# Patient Record
Sex: Female | Born: 1990 | Race: White | Hispanic: No | Marital: Single | Smoking: Current every day smoker
Health system: Southern US, Community
[De-identification: ages and names within clinical notes are randomized; demographics above are authoritative.]

## PROBLEM LIST (undated history)

## (undated) ENCOUNTER — Inpatient Hospital Stay (HOSPITAL_COMMUNITY): Payer: Self-pay

## (undated) DIAGNOSIS — M419 Scoliosis, unspecified: Secondary | ICD-10-CM

## (undated) DIAGNOSIS — Z3490 Encounter for supervision of normal pregnancy, unspecified, unspecified trimester: Principal | ICD-10-CM

## (undated) DIAGNOSIS — Z87898 Personal history of other specified conditions: Secondary | ICD-10-CM

## (undated) DIAGNOSIS — G8929 Other chronic pain: Secondary | ICD-10-CM

## (undated) DIAGNOSIS — M549 Dorsalgia, unspecified: Secondary | ICD-10-CM

## (undated) HISTORY — PX: KNEE SURGERY: SHX244

## (undated) HISTORY — DX: Encounter for supervision of normal pregnancy, unspecified, unspecified trimester: Z34.90

## (undated) HISTORY — DX: Dorsalgia, unspecified: M54.9

## (undated) HISTORY — DX: Scoliosis, unspecified: M41.9

## (undated) HISTORY — DX: Personal history of other specified conditions: Z87.898

## (undated) HISTORY — DX: Other chronic pain: G89.29

## (undated) HISTORY — PX: OTHER SURGICAL HISTORY: SHX169

---
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
## 2012-08-12 ENCOUNTER — Emergency Department (HOSPITAL_COMMUNITY)
Admission: EM | Admit: 2012-08-12 | Discharge: 2012-08-12 | Disposition: A | Discharge status: Home / Self Care | Payer: No Typology Code available for payment source | Attending: Emergency Medicine | Admitting: Emergency Medicine

## 2012-08-12 ENCOUNTER — Encounter (HOSPITAL_COMMUNITY): Payer: Self-pay | Admitting: *Deleted

## 2012-08-12 DIAGNOSIS — Y939 Activity, unspecified: Secondary | ICD-10-CM | POA: Insufficient documentation

## 2012-08-12 DIAGNOSIS — F17200 Nicotine dependence, unspecified, uncomplicated: Secondary | ICD-10-CM | POA: Insufficient documentation

## 2012-08-12 DIAGNOSIS — S43499A Other sprain of unspecified shoulder joint, initial encounter: Secondary | ICD-10-CM | POA: Insufficient documentation

## 2012-08-12 DIAGNOSIS — S46912A Strain of unspecified muscle, fascia and tendon at shoulder and upper arm level, left arm, initial encounter: Secondary | ICD-10-CM

## 2012-08-12 DIAGNOSIS — Z79899 Other long term (current) drug therapy: Secondary | ICD-10-CM | POA: Insufficient documentation

## 2012-08-12 MED ORDER — METHOCARBAMOL 500 MG PO TABS: ORAL_TABLET | ORAL | Status: DC

## 2012-08-12 MED ORDER — MELOXICAM 7.5 MG PO TABS: ORAL_TABLET | ORAL | Status: DC
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
## 2012-08-16 NOTE — ED Provider Notes (Signed)
Medical screening examination/treatment/procedure(s) were performed by non-physician practitioner and as supervising physician I was immediately available for consultation/collaboration.    Gearline Spilman R Kirtis Challis, MD 08/16/12 1532 

## 2012-12-25 ENCOUNTER — Emergency Department (HOSPITAL_COMMUNITY)
Admission: EM | Admit: 2012-12-25 | Discharge: 2012-12-25 | Disposition: A | Discharge status: Home / Self Care | Payer: Medicaid Other | Attending: Emergency Medicine | Admitting: Emergency Medicine

## 2012-12-25 ENCOUNTER — Encounter (HOSPITAL_COMMUNITY): Payer: Self-pay

## 2012-12-25 DIAGNOSIS — O99330 Smoking (tobacco) complicating pregnancy, unspecified trimester: Secondary | ICD-10-CM | POA: Insufficient documentation

## 2012-12-25 DIAGNOSIS — R21 Rash and other nonspecific skin eruption: Secondary | ICD-10-CM | POA: Insufficient documentation

## 2012-12-25 DIAGNOSIS — O9989 Other specified diseases and conditions complicating pregnancy, childbirth and the puerperium: Secondary | ICD-10-CM | POA: Insufficient documentation

## 2012-12-25 DIAGNOSIS — L089 Local infection of the skin and subcutaneous tissue, unspecified: Secondary | ICD-10-CM

## 2012-12-25 MED ORDER — SULFAMETHOXAZOLE-TRIMETHOPRIM 800-160 MG PO TABS: 1.0000 | ORAL_TABLET | Freq: Two times a day (BID) | ORAL | Status: DC

## 2012-12-25 NOTE — ED Notes (Signed)
Pt presents with red raised rash with prominent white center of papules. Pt also has new tattoo on left thigh, where rash is predominant.  Pt also denies fever as did partner that has same rash and recent tattoo. NAD noted.

## 2012-12-25 NOTE — ED Provider Notes (Signed)
History  This chart was scribed for Benny Lennert, MD by Bennett Scrape, ED Scribe. This patient was seen in room APFT20/APFT20 and the patient's care was started at 1:39 PM.  CSN: [id number]  Arrival date & time 12/25/12  1210   First MD Initiated Contact with Patient 12/25/12 1339      Chief Complaint  Patient presents with  . Rash     Patient is a 22 y.o. female presenting with rash. The history is provided by the patient. No language interpreter was used.  Rash Location:  Leg Leg rash location:  L upper leg Quality: itchiness and painful   Onset quality:  Gradual Duration:  2 days Timing:  Constant Progression:  Worsening Chronicity:  New Context: sick contacts   Relieved by:  Nothing Worsened by:  Nothing tried Ineffective treatments:  None tried Associated symptoms: no fatigue, no nausea and not vomiting    Laurie Norris is a 22 y.o. female who is currently 7 months pregnant who presents to the Emergency Department complaining of 2 to 3 days of gradual onset, gradually worsening, constant rash described as sore and itchy to the left anterior upper leg. She denies any known insect bites. She reports that her boyfriend has the same symptoms. She states that she has an Chief Financial Officer appointment tomorrow. She denies any other symptoms currently. She does not have a h/o chronic medical conditions. 7 lesions to her left anterior upper thigh that are tender with surrounding erythema   History reviewed. No pertinent past medical history.  Past Surgical History  Procedure Laterality Date  . Knee surgery    . Leg surgery from trauma      Family History  Problem Relation Age of Onset  . Diabetes      History  Substance Use Topics  . Smoking status: Current Every Day Smoker    Types: Cigarettes  . Smokeless tobacco: Not on file  . Alcohol Use: No    OB History   Grav Para Term Preterm Abortions TAB SAB Ect Mult Living   1               Review of Systems   Constitutional: Negative for chills and fatigue.  Gastrointestinal: Negative for nausea and vomiting.  Skin: Positive for rash.    Allergies  Penicillins  Home Medications   Current Outpatient Rx  Name  Route  Sig  Dispense  Refill  . flintstones complete (FLINTSTONES) 60 MG chewable tablet   Oral   Chew 1 tablet by mouth daily.           Triage Vitals: BP 104/58  Pulse 80  Temp(Src) 98.1 F (36.7 C) (Oral)  Resp 18  Ht 5\' 2"  (1.575 m)  Wt 140 lb (63.504 kg)  BMI 25.6 kg/m2  SpO2 100%  LMP 07/17/2012  Physical Exam  Nursing note and vitals reviewed. Constitutional: She is oriented to person, place, and time. She appears well-developed and well-nourished. No distress.  HENT:  Head: Normocephalic and atraumatic.  Eyes: Conjunctivae are normal.  Neck: Neck supple. No tracheal deviation present.  Cardiovascular: Normal rate.   No murmur heard. Pulmonary/Chest: Effort normal.  Musculoskeletal: Normal range of motion. She exhibits no edema.  Neurological: She is alert and oriented to person, place, and time.  Skin: Skin is warm and dry.  7 lesions to her left anterior upper thigh that are tender with surrounding erythema and one the the LUQ of the abdomen  Psychiatric: She has a normal  mood and affect. Her behavior is normal.    ED Course  Procedures (including critical care time)  DIAGNOSTIC STUDIES: Oxygen Saturation is 100% on room air, normal by my interpretation.    COORDINATION OF CARE: 1:46 PM-Discussed discharge plan which includes antibiotics with pt and pt agreed to plan. Also advised pt to follow up with OB-GYN as planned and pt agreed.  Labs Reviewed - No data to display No results found.   No diagnosis found.    MDM   The chart was scribed for me under my direct supervision.  I personally performed the history, physical, and medical decision making and all procedures in the evaluation of this patient.Benny Lennert, MD 12/25/12  1351

## 2012-12-25 NOTE — ED Notes (Signed)
Pt c/o itchy rash to left upper leg x 2 or 3 days ago.  Reports boyfriend has similar rash.  PT also reports is 7months pregnant but is denying any pregnancy related complaints.

## 2014-08-24 ENCOUNTER — Encounter (HOSPITAL_COMMUNITY): Payer: Self-pay

## 2014-10-23 NOTE — L&D Delivery Note (Cosign Needed)
Delivery Note Pt pushed well and at 4:14 AM a viable female was delivered via Vaginal, Spontaneous Delivery (Presentation: Left Occiput Anterior).  APGAR: 6, 9; weight 8 lb 6.6 oz (3815 g). Infant dried and lifted to pt's abd. Cord clamped and cut by pt. Hospital cord blood sample collected.  Placenta status: Intact, Manual removal.  Cord: 3 vessels  Anesthesia: Epidural  Episiotomy: None Lacerations: None Est. Blood Loss (mL): 328   Mom to postpartum.  Baby to Couplet care / Skin to Skin.   Cam Hai CNM 04/17/2015, 4:34 AM

## 2014-10-26 ENCOUNTER — Ambulatory Visit (INDEPENDENT_AMBULATORY_CARE_PROVIDER_SITE_OTHER): Payer: Medicaid Other | Admitting: Adult Health

## 2014-10-26 ENCOUNTER — Encounter: Payer: Self-pay | Admitting: Adult Health

## 2014-10-26 ENCOUNTER — Encounter: Payer: Medicaid Other | Admitting: Adult Health

## 2014-10-26 VITALS — BP 98/48 | Ht 64.0 in | Wt 121.5 lb

## 2014-10-26 DIAGNOSIS — Z3490 Encounter for supervision of normal pregnancy, unspecified, unspecified trimester: Secondary | ICD-10-CM | POA: Insufficient documentation

## 2014-10-26 DIAGNOSIS — M549 Dorsalgia, unspecified: Secondary | ICD-10-CM

## 2014-10-26 DIAGNOSIS — F1191 Opioid use, unspecified, in remission: Secondary | ICD-10-CM | POA: Insufficient documentation

## 2014-10-26 DIAGNOSIS — G8929 Other chronic pain: Secondary | ICD-10-CM

## 2014-10-26 DIAGNOSIS — F1310 Sedative, hypnotic or anxiolytic abuse, uncomplicated: Secondary | ICD-10-CM

## 2014-10-26 DIAGNOSIS — Z3201 Encounter for pregnancy test, result positive: Secondary | ICD-10-CM

## 2014-10-26 DIAGNOSIS — Z87898 Personal history of other specified conditions: Secondary | ICD-10-CM

## 2014-10-26 HISTORY — DX: Personal history of other specified conditions: Z87.898

## 2014-10-26 HISTORY — DX: Encounter for supervision of normal pregnancy, unspecified, unspecified trimester: Z34.90

## 2014-10-26 HISTORY — DX: Other chronic pain: G89.29

## 2014-10-26 HISTORY — DX: Opioid use, unspecified, in remission: F11.91

## 2014-10-26 LAB — POCT URINE PREGNANCY: PREG TEST UR: POSITIVE

## 2014-10-26 NOTE — Progress Notes (Signed)
Subjective:     Patient ID: Laurie Norris, female   DOB: [birth year], 24 y.o.   MRN: [id number]  HPI [name] is a 24 year old white female in for UPT.She complains of dizzy spells at times.She has history of chronic back pain and was taking 10-11 percocet or hydrocodone for years and has tried to wean her self off and is down to 3 in over a 4 week period, but she is getting them off the street and she is smoking pot to help with this.   Review of Systems See HPI Reviewed past medical,surgical, social and family history. Reviewed medications and allergies.     Objective:   Physical Exam BP 98/48 mmHg  Ht  (1.626 m)  Wt 121 lb 8 oz (55.112 kg)  BMI 20.85 kg/m2  LMP 11/01/2015UPT +, about 9+1 weeks with EDD 05/31/15 by LMP,medicaid form given.Will get in ASAP for [place] and new OB visit.She is taking flintstones.Sarie knows that she should not take the meds without RX and needs to stop pot.    Assessment:     Pregnant +UPT Chronic back pain  History of narcotic use    Plan:     Return in 1 day for dating [place] and new OB with Kim,I spoke with Selena Batten today about her and her history Review handout on first trimester and chronic back pain

## 2014-10-26 NOTE — Patient Instructions (Signed)
Chronic Back Pain  When back pain lasts longer than 3 months, it is called chronic back pain.People with chronic back pain often go through certain periods that are more intense (flare-ups).  CAUSES Chronic back pain can be caused by wear and tear (degeneration) on different structures in your back. These structures include:  The bones of your spine (vertebrae) and the joints surrounding your spinal cord and nerve roots (facets).  The strong, fibrous tissues that connect your vertebrae (ligaments). Degeneration of these structures may result in pressure on your nerves. This can lead to constant pain. HOME CARE INSTRUCTIONS  Avoid bending, heavy lifting, prolonged sitting, and activities which make the problem worse.  Take brief periods of rest throughout the day to reduce your pain. Lying down or standing usually is better than sitting while you are resting.  Take over-the-counter or prescription medicines only as directed by your caregiver. SEEK IMMEDIATE MEDICAL CARE IF:   You have weakness or numbness in one of your legs or feet.  You have trouble controlling your bladder or bowels.  You have nausea, vomiting, abdominal pain, shortness of breath, or fainting. Document Released: 11/16/2004 Document Revised: 01/01/2012 Document Reviewed: 09/23/2011 [facility] Patient Information 2015 [place], [place]. This information is not intended to replace advice given to you by your health care provider. Make sure you discuss any questions you have with your health care provider. First Trimester of Pregnancy The first trimester of pregnancy is from week 1 until the end of week 12 (months 1 through 3). A week after a sperm fertilizes an egg, the egg will implant on the wall of the uterus. This embryo will begin to develop into a baby. Genes from you and your partner are forming the baby. The female genes determine whether the baby is a boy or a girl. At 6-8 weeks, the eyes and face are formed, and the  heartbeat can be seen on ultrasound. At the end of 12 weeks, all the baby's organs are formed.  Now that you are pregnant, you will want to do everything you can to have a healthy baby. Two of the most important things are to get good prenatal care and to follow your health care provider's instructions. Prenatal care is all the medical care you receive before the baby's birth. This care will help prevent, find, and treat any problems during the pregnancy and childbirth. BODY CHANGES Your body goes through many changes during pregnancy. The changes vary from woman to woman.   You may gain or lose a couple of pounds at first.  You may feel sick to your stomach (nauseous) and throw up (vomit). If the vomiting is uncontrollable, call your health care provider.  You may tire easily.  You may develop headaches that can be relieved by medicines approved by your health care provider.  You may urinate more often. Painful urination may mean you have a bladder infection.  You may develop heartburn as a result of your pregnancy.  You may develop constipation because certain hormones are causing the muscles that push waste through your intestines to slow down.  You may develop hemorrhoids or swollen, bulging veins (varicose veins).  Your breasts may begin to grow larger and become tender. Your nipples may stick out more, and the tissue that surrounds them (areola) may become darker.  Your gums may bleed and may be sensitive to brushing and flossing.  Dark spots or blotches (chloasma, mask of pregnancy) may develop on your face. This will likely fade after  the baby is born.  Your menstrual periods will stop.  You may have a loss of appetite.  You may develop cravings for certain kinds of food.  You may have changes in your emotions from day to day, such as being excited to be pregnant or being concerned that something may go wrong with the pregnancy and baby.  You may have more vivid and strange  dreams.  You may have changes in your hair. These can include thickening of your hair, rapid growth, and changes in texture. Some women also have hair loss during or after pregnancy, or hair that feels dry or thin. Your hair will most likely return to normal after your baby is born. WHAT TO EXPECT AT YOUR PRENATAL VISITS During a routine prenatal visit:  You will be weighed to make sure you and the baby are growing normally.  Your blood pressure will be taken.  Your abdomen will be measured to track your baby's growth.  The fetal heartbeat will be listened to starting around week 10 or 12 of your pregnancy.  Test results from any previous visits will be discussed. Your health care provider may ask you:  How you are feeling.  If you are feeling the baby move.  If you have had any abnormal symptoms, such as leaking fluid, bleeding, severe headaches, or abdominal cramping.  If you have any questions. Other tests that may be performed during your first trimester include:  Blood tests to find your blood type and to check for the presence of any previous infections. They will also be used to check for low iron levels (anemia) and Rh antibodies. Later in the pregnancy, blood tests for diabetes will be done along with other tests if problems develop.  Urine tests to check for infections, diabetes, or protein in the urine.  An ultrasound to confirm the proper growth and development of the baby.  An amniocentesis to check for possible genetic problems.  Fetal screens for spina bifida and Down syndrome.  You may need other tests to make sure you and the baby are doing well. HOME CARE INSTRUCTIONS  Medicines  Follow your health care provider's instructions regarding medicine use. Specific medicines may be either safe or unsafe to take during pregnancy.  Take your prenatal vitamins as directed.  If you develop constipation, try taking a stool softener if your health care provider  approves. Diet  Eat regular, well-balanced meals. Choose a variety of foods, such as meat or vegetable-based protein, fish, milk and low-fat dairy products, vegetables, fruits, and whole grain breads and cereals. Your health care provider will help you determine the amount of weight gain that is right for you.  Avoid raw meat and uncooked cheese. These carry germs that can cause birth defects in the baby.  Eating four or five small meals rather than three large meals a day may help relieve nausea and vomiting. If you start to feel nauseous, eating a few soda crackers can be helpful. Drinking liquids between meals instead of during meals also seems to help nausea and vomiting.  If you develop constipation, eat more high-fiber foods, such as fresh vegetables or fruit and whole grains. Drink enough fluids to keep your urine clear or pale yellow. Activity and Exercise  Exercise only as directed by your health care provider. Exercising will help you:  Control your weight.  Stay in shape.  Be prepared for labor and delivery.  Experiencing pain or cramping in the lower abdomen or low back is  a good sign that you should stop exercising. Check with your health care provider before continuing normal exercises.  Try to avoid standing for long periods of time. Move your legs often if you must stand in one place for a long time.  Avoid heavy lifting.  Wear low-heeled shoes, and practice good posture.  You may continue to have sex unless your health care provider directs you otherwise. Relief of Pain or Discomfort  Wear a good support bra for breast tenderness.   Take warm sitz baths to soothe any pain or discomfort caused by hemorrhoids. Use hemorrhoid cream if your health care provider approves.   Rest with your legs elevated if you have leg cramps or low back pain.  If you develop varicose veins in your legs, wear support hose. Elevate your feet for 15 minutes, 3-4 times a day. Limit salt  in your diet. Prenatal Care  Schedule your prenatal visits by the twelfth week of pregnancy. They are usually scheduled monthly at first, then more often in the last 2 months before delivery.  Write down your questions. Take them to your prenatal visits.  Keep all your prenatal visits as directed by your health care provider. Safety  Wear your seat belt at all times when driving.  Make a list of emergency phone numbers, including numbers for family, friends, the hospital, and police and fire departments. General Tips  Ask your health care provider for a referral to a local prenatal education class. Begin classes no later than at the beginning of month 6 of your pregnancy.  Ask for help if you have counseling or nutritional needs during pregnancy. Your health care provider can offer advice or refer you to specialists for help with various needs.  Do not use hot tubs, steam rooms, or saunas.  Do not douche or use tampons or scented sanitary pads.  Do not cross your legs for long periods of time.  Avoid cat litter boxes and soil used by cats. These carry germs that can cause birth defects in the baby and possibly loss of the fetus by miscarriage or stillbirth.  Avoid all smoking, herbs, alcohol, and medicines not prescribed by your health care provider. Chemicals in these affect the formation and growth of the baby.  Schedule a dentist appointment. At home, brush your teeth with a soft toothbrush and be gentle when you floss. SEEK MEDICAL CARE IF:   You have dizziness.  You have mild pelvic cramps, pelvic pressure, or nagging pain in the abdominal area.  You have persistent nausea, vomiting, or diarrhea.  You have a bad smelling vaginal discharge.  You have pain with urination.  You notice increased swelling in your face, hands, legs, or ankles. SEEK IMMEDIATE MEDICAL CARE IF:   You have a fever.  You are leaking fluid from your vagina.  You have spotting or bleeding  from your vagina.  You have severe abdominal cramping or pain.  You have rapid weight gain or loss.  You vomit blood or material that looks like coffee grounds.  You are exposed to [place] measles and have never had them.  You are exposed to fifth disease or chickenpox.  You develop a severe headache.  You have shortness of breath.  You have any kind of trauma, such as from a fall or a car accident. Document Released: 10/03/2001 Document Revised: 02/23/2014 Document Reviewed: 08/19/2013 [facility] Patient Information 2015 [place], [place]. This information is not intended to replace advice given to you by your health care  provider. Make sure you discuss any questions you have with your health care provider. Return in 1 day for [place] and see Selena Batten

## 2014-10-27 ENCOUNTER — Ambulatory Visit: Payer: Medicaid Other

## 2014-10-27 ENCOUNTER — Encounter: Payer: Medicaid Other | Admitting: Women's Health

## 2014-10-29 ENCOUNTER — Other Ambulatory Visit: Payer: Self-pay | Admitting: Adult Health

## 2014-10-29 ENCOUNTER — Ambulatory Visit (INDEPENDENT_AMBULATORY_CARE_PROVIDER_SITE_OTHER): Payer: Medicaid Other

## 2014-10-29 DIAGNOSIS — O0932 Supervision of pregnancy with insufficient antenatal care, second trimester: Secondary | ICD-10-CM

## 2014-10-29 DIAGNOSIS — O99320 Drug use complicating pregnancy, unspecified trimester: Secondary | ICD-10-CM

## 2014-10-29 DIAGNOSIS — O3680X1 Pregnancy with inconclusive fetal viability, fetus 1: Secondary | ICD-10-CM

## 2014-10-29 DIAGNOSIS — F1910 Other psychoactive substance abuse, uncomplicated: Secondary | ICD-10-CM

## 2014-10-29 DIAGNOSIS — Z3490 Encounter for supervision of normal pregnancy, unspecified, unspecified trimester: Secondary | ICD-10-CM

## 2014-10-29 NOTE — Progress Notes (Signed)
U/S-single active fetus, meas c/w 16+6 wks EDD 04/09/2015, cx appears closed(3.5cm), fluid WNL, posterior Gr 0 placenta, bilateral adnexa appears WNL, FHR-155 bpm, ?female fetus?, pt returning for an anatomy screen in 2-3 wks

## 2014-11-03 ENCOUNTER — Ambulatory Visit (INDEPENDENT_AMBULATORY_CARE_PROVIDER_SITE_OTHER): Payer: Medicaid Other | Admitting: Women's Health

## 2014-11-03 ENCOUNTER — Encounter: Payer: Self-pay | Admitting: Women's Health

## 2014-11-03 VITALS — BP 98/62 | Wt 121.0 lb

## 2014-11-03 DIAGNOSIS — O09892 Supervision of other high risk pregnancies, second trimester: Secondary | ICD-10-CM

## 2014-11-03 DIAGNOSIS — F1210 Cannabis abuse, uncomplicated: Secondary | ICD-10-CM

## 2014-11-03 DIAGNOSIS — Z363 Encounter for antenatal screening for malformations: Secondary | ICD-10-CM

## 2014-11-03 DIAGNOSIS — F1120 Opioid dependence, uncomplicated: Secondary | ICD-10-CM | POA: Insufficient documentation

## 2014-11-03 DIAGNOSIS — Z3482 Encounter for supervision of other normal pregnancy, second trimester: Secondary | ICD-10-CM

## 2014-11-03 DIAGNOSIS — Z124 Encounter for screening for malignant neoplasm of cervix: Secondary | ICD-10-CM

## 2014-11-03 DIAGNOSIS — Z1371 Encounter for nonprocreative screening for genetic disease carrier status: Secondary | ICD-10-CM

## 2014-11-03 DIAGNOSIS — F17200 Nicotine dependence, unspecified, uncomplicated: Secondary | ICD-10-CM

## 2014-11-03 DIAGNOSIS — Z118 Encounter for screening for other infectious and parasitic diseases: Secondary | ICD-10-CM

## 2014-11-03 DIAGNOSIS — Z0283 Encounter for blood-alcohol and blood-drug test: Secondary | ICD-10-CM

## 2014-11-03 DIAGNOSIS — Z331 Pregnant state, incidental: Secondary | ICD-10-CM

## 2014-11-03 DIAGNOSIS — Z1159 Encounter for screening for other viral diseases: Secondary | ICD-10-CM

## 2014-11-03 DIAGNOSIS — O99322 Drug use complicating pregnancy, second trimester: Secondary | ICD-10-CM

## 2014-11-03 DIAGNOSIS — F1410 Cocaine abuse, uncomplicated: Secondary | ICD-10-CM

## 2014-11-03 DIAGNOSIS — Z0184 Encounter for antibody response examination: Secondary | ICD-10-CM

## 2014-11-03 DIAGNOSIS — F1129 Opioid dependence with unspecified opioid-induced disorder: Secondary | ICD-10-CM

## 2014-11-03 DIAGNOSIS — Z1389 Encounter for screening for other disorder: Secondary | ICD-10-CM

## 2014-11-03 DIAGNOSIS — Z114 Encounter for screening for human immunodeficiency virus [HIV]: Secondary | ICD-10-CM

## 2014-11-03 DIAGNOSIS — O09899 Supervision of other high risk pregnancies, unspecified trimester: Secondary | ICD-10-CM | POA: Insufficient documentation

## 2014-11-03 DIAGNOSIS — Z720 Tobacco use: Secondary | ICD-10-CM

## 2014-11-03 DIAGNOSIS — Z113 Encounter for screening for infections with a predominantly sexual mode of transmission: Secondary | ICD-10-CM

## 2014-11-03 LAB — POCT URINALYSIS DIPSTICK
Blood, UA: NEGATIVE
Glucose, UA: NEGATIVE
Ketones, UA: NEGATIVE
Leukocytes, UA: NEGATIVE
Nitrite, UA: NEGATIVE
PROTEIN UA: NEGATIVE

## 2014-11-03 NOTE — Progress Notes (Addendum)
Subjective:  Laurie Norris is a 24 y.o. 102P1001 Caucasian female at 7961w4d by 16wk u/s, being seen today for her first obstetrical visit.  Her obstetrical history is significant for term uncomplicated SVB of 10lb baby w/o GDM 1st pregnancy. Smoker 1-2ppd now down to 1/2ppd-not ready to quit. Chronic back pain/scoliosis, addicted to oxycodone 10/325mg  10-15/day has weaned herself down to 5/325mg  TID, no rx, buys off street. .  Daily ha's, taking ~6 500mg  apap daily in addition to the apap in her oxycodone. Pregnancy history fully reviewed.  Patient reports some nausea, usually vomits once q am- delcines meds at this time. Denies vb, cramping, uti s/s, abnormal/malodorous vag d/c, or vulvovaginal itching/irritation.  BP 98/62 mmHg  Wt 121 lb (54.885 kg)  LMP 08/23/2014  HISTORY: OB History  Gravida Para Term Preterm AB SAB TAB Ectopic Multiple Living  2 1 1       1     # Outcome Date GA Lbr Len/2nd Weight Sex Delivery Anes PTL Lv  2 Current           1 Term 05/13/11 3523w0d  10 lb 2 oz (4.593 kg) M Vag-Spont  N Y    Obstetric Comments  No GDM   Past Medical History  Diagnosis Date  . Scoliosis   . Chronic back pain 10/26/2014  . History of narcotic use 10/26/2014  . Pregnant 10/26/2014   Past Surgical History  Procedure Laterality Date  . Knee surgery    . Leg surgery from trauma     Family History  Problem Relation Age of Onset  . Diabetes Brother   . COPD Maternal Grandmother   . Arthritis Maternal Grandfather     Exam   System:     General: Well developed & nourished, no acute distress   Skin: Warm & dry, normal coloration and turgor, no rashes   Neurologic: Alert & oriented, normal mood   Cardiovascular: Regular rate & rhythm   Respiratory: Effort & rate normal, LCTAB, acyanotic   Abdomen: Soft, non tender   Extremities: normal strength, tone  [facility]  FHR: 148 via doppler   Assessment:   Pregnancy: G2P1001 Patient Active  Problem List   Diagnosis Date Noted  . Supervision of other high-risk pregnancy 11/03/2014    Priority: High  . Opiate addiction 11/03/2014    Priority: High  . Chronic back pain 10/26/2014  . History of narcotic use 10/26/2014  . Pregnant 10/26/2014  . 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 BUCKET HANDLE TEAR OF LATERAL MENISCUS 09/27/2009  . KNEE JOINT INSTABILITY 09/01/2009    9061w4d G2P1001 New OB visit Smoker Chronic back pain/scoliosis Opioid addiction, no rx N/V HA Excessive apap use H/O LGA infant w/o GDM  Plan:  Initial labs drawn Continue prenatal vitamins Problem list reviewed and updated Reviewed n/v relief measures and warning s/s to report Reviewed recommended weight gain based on pre-gravid BMI Encouraged well-balanced diet Genetic Screening discussed Quad Screen: requested Cystic fibrosis screening discussed requested Ultrasound discussed; fetal survey: requested Follow up in 1 weeks for visit w/ [facility] to discuss pain med plan for pregnancy, then 2 weeks for anatomy u/s & visit To decrease apap, no more than 4g total/day- less if possible Increase fluids, eat small frequent meals/snacks w/ protein to see if helps w/ HAs [facility] completed [facility] Smokes 1/2pp/day, advised cessation, discussed risks to fetus while pregnant, to infant pp, and to herself. Offered QuitlineNC, declined.  Marge Duncans CNM, [facility] 11/03/2014 2:12 PM

## 2014-11-03 NOTE — Patient Instructions (Addendum)
Nausea & Vomiting  Have saltine crackers or pretzels by your bed and eat a few bites before you raise your head out of bed in the morning  Eat small frequent meals throughout the day instead of large meals  Drink plenty of fluids throughout the day to stay hydrated, just don't drink a lot of fluids with your meals.  This can make your stomach fill up faster making you feel sick  Do not brush your teeth right after you eat  Products with real ginger are good for nausea, like ginger ale and ginger hard candy Make sure it says made with real ginger!  Sucking on sour candy like lemon heads is also good for nausea  If your prenatal vitamins make you nauseated, take them at night so you will sleep through the nausea  Sea Bands  If you feel like you need medicine for the nausea & vomiting please let us know  If you are unable to keep any fluids or food down please let us know    Second Trimester of Pregnancy The second trimester is from week 13 through week 28, months 4 through 6. The second trimester is often a time when you feel your best. Your body has also adjusted to being pregnant, and you begin to feel better physically. Usually, morning sickness has lessened or quit completely, you may have more energy, and you may have an increase in appetite. The second trimester is also a time when the fetus is growing rapidly. At the end of the sixth month, the fetus is about 9 inches long and weighs about 1 pounds. You will likely begin to feel the baby move (quickening) between 18 and 20 weeks of the pregnancy. BODY CHANGES Your body goes through many changes during pregnancy. The changes vary from woman to woman.  11. Your weight will continue to increase. You will notice your lower abdomen bulging out. 12. You may begin to get stretch marks on your hips, abdomen, and breasts. 13. You may develop headaches that can be relieved by medicines approved by your health care provider. 14. You may  urinate more often because the fetus is pressing on your bladder. 15. You may develop or continue to have heartburn as a result of your pregnancy. 16. You may develop constipation because certain hormones are causing the muscles that push waste through your intestines to slow down. 17. You may develop hemorrhoids or swollen, bulging veins (varicose veins). 18. You may have back pain because of the weight gain and pregnancy hormones relaxing your joints between the bones in your pelvis and as a result of a shift in weight and the muscles that support your balance. 19. Your breasts will continue to grow and be tender. 20. Your gums may bleed and may be sensitive to brushing and flossing. 21. Dark spots or blotches (chloasma, mask of pregnancy) may develop on your face. This will likely fade after the baby is born. 22. A dark line from your belly button to the pubic area (linea nigra) may appear. This will likely fade after the baby is born. 23. You may have changes in your hair. These can include thickening of your hair, rapid growth, and changes in texture. Some women also have hair loss during or after pregnancy, or hair that feels dry or thin. Your hair will most likely return to normal after your baby is born. WHAT TO EXPECT AT YOUR PRENATAL VISITS During a routine prenatal visit:  You will be weighed to  make sure you and the fetus are growing normally.  Your blood pressure will be taken.  Your abdomen will be measured to track your baby's growth.  The fetal heartbeat will be listened to.  Any test results from the previous visit will be discussed. Your health care provider may ask you:  How you are feeling.  If you are feeling the baby move.  If you have had any abnormal symptoms, such as leaking fluid, bleeding, severe headaches, or abdominal cramping.  If you have any questions. Other tests that may be performed during your second trimester include:  Blood tests that check  for:  Low iron levels (anemia).  Gestational diabetes (between 24 and 28 weeks).  Rh antibodies.  Urine tests to check for infections, diabetes, or protein in the urine.  An ultrasound to confirm the proper growth and development of the baby.  An amniocentesis to check for possible genetic problems.  Fetal screens for spina bifida and Down syndrome. HOME CARE INSTRUCTIONS   Avoid all smoking, herbs, alcohol, and unprescribed drugs. These chemicals affect the formation and growth of the baby.  Follow your health care provider's instructions regarding medicine use. There are medicines that are either safe or unsafe to take during pregnancy.  Exercise only as directed by your health care provider. Experiencing uterine cramps is a good sign to stop exercising.  Continue to eat regular, healthy meals.  Wear a good support bra for breast tenderness.  Do not use hot tubs, steam rooms, or saunas.  Wear your seat belt at all times when driving.  Avoid raw meat, uncooked cheese, cat litter boxes, and soil used by cats. These carry germs that can cause birth defects in the baby.  Take your prenatal vitamins.  Try taking a stool softener (if your health care provider approves) if you develop constipation. Eat more high-fiber foods, such as fresh vegetables or fruit and whole grains. Drink plenty of fluids to keep your urine clear or pale yellow.  Take warm sitz baths to soothe any pain or discomfort caused by hemorrhoids. Use hemorrhoid cream if your health care provider approves.  If you develop varicose veins, wear support hose. Elevate your feet for 15 minutes, 3-4 times a day. Limit salt in your diet.  Avoid heavy lifting, wear low heel shoes, and practice good posture.  Rest with your legs elevated if you have leg cramps or low back pain.  Visit your dentist if you have not gone yet during your pregnancy. Use a soft toothbrush to brush your teeth and be gentle when you  floss.  A sexual relationship may be continued unless your health care provider directs you otherwise.  Continue to go to all your prenatal visits as directed by your health care provider. SEEK MEDICAL CARE IF:   You have dizziness.  You have mild pelvic cramps, pelvic pressure, or nagging pain in the abdominal area.  You have persistent nausea, vomiting, or diarrhea.  You have a bad smelling vaginal discharge.  You have pain with urination. SEEK IMMEDIATE MEDICAL CARE IF:   You have a fever.  You are leaking fluid from your vagina.  You have spotting or bleeding from your vagina.  You have severe abdominal cramping or pain.  You have rapid weight gain or loss.  You have shortness of breath with chest pain.  You notice sudden or extreme swelling of your face, hands, ankles, feet, or legs.  You have not felt your baby move in over an hour.  You have severe headaches that do not go away with medicine.  You have vision changes. Document Released: 10/03/2001 Document Revised: 10/14/2013 Document Reviewed: 12/10/2012 [facility] Patient Information 2015 [place], [place]. This information is not intended to replace advice given to you by your health care provider. Make sure you discuss any questions you have with your health care provider.

## 2014-11-04 ENCOUNTER — Encounter: Payer: Self-pay | Admitting: Women's Health

## 2014-11-04 DIAGNOSIS — O99320 Drug use complicating pregnancy, unspecified trimester: Secondary | ICD-10-CM | POA: Insufficient documentation

## 2014-11-04 DIAGNOSIS — Z6791 Unspecified blood type, Rh negative: Secondary | ICD-10-CM | POA: Insufficient documentation

## 2014-11-04 DIAGNOSIS — O26899 Other specified pregnancy related conditions, unspecified trimester: Secondary | ICD-10-CM

## 2014-11-04 DIAGNOSIS — F1290 Cannabis use, unspecified, uncomplicated: Secondary | ICD-10-CM | POA: Insufficient documentation

## 2014-11-04 DIAGNOSIS — F1490 Cocaine use, unspecified, uncomplicated: Secondary | ICD-10-CM

## 2014-11-04 LAB — COMPREHENSIVE METABOLIC PANEL
ALT: 9 U/L (ref 0–35)
AST: 11 U/L (ref 0–37)
Albumin: 3.7 g/dL (ref 3.5–5.2)
Alkaline Phosphatase: 61 U/L (ref 39–117)
BILIRUBIN TOTAL: 0.4 mg/dL (ref 0.2–1.2)
BUN: 11 mg/dL (ref 6–23)
CO2: 22 mEq/L (ref 19–32)
Calcium: 8.8 mg/dL (ref 8.4–10.5)
Chloride: 105 mEq/L (ref 96–112)
Creat: 0.55 mg/dL (ref 0.50–1.10)
Glucose, Bld: 72 mg/dL (ref 70–99)
Potassium: 4.2 mEq/L (ref 3.5–5.3)
SODIUM: 138 meq/L (ref 135–145)
Total Protein: 6.6 g/dL (ref 6.0–8.3)

## 2014-11-04 LAB — VARICELLA ZOSTER ANTIBODY, IGG: VARICELLA IGG: 216.9 {index} — AB (ref <135.00)

## 2014-11-04 LAB — CBC
HEMATOCRIT: 36.2 % (ref 36.0–46.0)
HEMOGLOBIN: 12.2 g/dL (ref 12.0–15.0)
MCH: 30.8 pg (ref 26.0–34.0)
MCHC: 33.7 g/dL (ref 30.0–36.0)
MCV: 91.4 fL (ref 78.0–100.0)
MPV: 10.3 fL (ref 8.6–12.4)
Platelets: 257 10*3/uL (ref 150–400)
RBC: 3.96 MIL/uL (ref 3.87–5.11)
RDW: 13.6 % (ref 11.5–15.5)
WBC: 7.5 10*3/uL (ref 4.0–10.5)

## 2014-11-04 LAB — CYSTIC FIBROSIS DIAGNOSTIC STUDY

## 2014-11-04 LAB — URINALYSIS, ROUTINE W REFLEX MICROSCOPIC
BILIRUBIN URINE: NEGATIVE
GLUCOSE, UA: NEGATIVE mg/dL
HGB URINE DIPSTICK: NEGATIVE
Ketones, ur: NEGATIVE mg/dL
LEUKOCYTES UA: NEGATIVE
Nitrite: NEGATIVE
PROTEIN: NEGATIVE mg/dL
Specific Gravity, Urine: 1.026 (ref 1.005–1.030)
UROBILINOGEN UA: 0.2 mg/dL (ref 0.0–1.0)
pH: 7 (ref 5.0–8.0)

## 2014-11-04 LAB — OXYCODONE SCREEN, UA, RFLX CONFIRM

## 2014-11-04 LAB — DRUG SCREEN, URINE, NO CONFIRMATION
AMPHETAMINE SCRN UR: NEGATIVE
BARBITURATE QUANT UR: NEGATIVE
Benzodiazepines.: NEGATIVE
CREATININE, U: 200.1 mg/dL
Cocaine Metabolites: POSITIVE — AB
Marijuana Metabolite: POSITIVE — AB
Methadone: NEGATIVE
OPIATE SCREEN, URINE: NEGATIVE
Phencyclidine (PCP): NEGATIVE
Propoxyphene: NEGATIVE

## 2014-11-04 LAB — HEPATITIS B SURFACE ANTIGEN: HEP B S AG: NEGATIVE

## 2014-11-04 LAB — GC/CHLAMYDIA PROBE AMP
CT Probe RNA: NEGATIVE
GC PROBE AMP APTIMA: NEGATIVE

## 2014-11-04 LAB — ABO AND RH: Rh Type: NEGATIVE

## 2014-11-04 LAB — HIV ANTIBODY (ROUTINE TESTING W REFLEX): HIV: NONREACTIVE

## 2014-11-04 LAB — ANTIBODY SCREEN: Antibody Screen: NEGATIVE

## 2014-11-04 LAB — RPR

## 2014-11-04 LAB — RUBELLA SCREEN: Rubella: 2.03 Index — ABNORMAL HIGH (ref <0.90)

## 2014-11-05 LAB — URINE CULTURE: Colony Count: >=100000

## 2014-11-06 ENCOUNTER — Encounter: Payer: Medicaid Other | Admitting: Obstetrics and Gynecology

## 2014-11-08 LAB — OPIATES/OPIOIDS (LC/MS-MS)
CODEINE URINE: NEGATIVE ng/mL (ref <50)
HYDROCODONE: 77 ng/mL — AB (ref <50)
Hydromorphone: 578 ng/mL — ABNORMAL HIGH (ref <50)
MORPHINE: NEGATIVE ng/mL (ref <50)
NORHYDROCODONE, UR: 1085 ng/mL — AB (ref <50)
Noroxycodone, Ur: 2209 ng/mL — ABNORMAL HIGH (ref <50)
OXYMORPHONE, URINE: 523 ng/mL — AB (ref <50)
Oxycodone, ur: 90 ng/mL — ABNORMAL HIGH (ref <50)

## 2014-11-09 ENCOUNTER — Encounter: Payer: Self-pay | Admitting: Obstetrics & Gynecology

## 2014-11-09 ENCOUNTER — Ambulatory Visit (INDEPENDENT_AMBULATORY_CARE_PROVIDER_SITE_OTHER): Payer: Medicaid Other | Admitting: Obstetrics & Gynecology

## 2014-11-09 VITALS — BP 96/50 | Wt 122.0 lb

## 2014-11-09 DIAGNOSIS — F1129 Opioid dependence with unspecified opioid-induced disorder: Secondary | ICD-10-CM

## 2014-11-09 DIAGNOSIS — Z331 Pregnant state, incidental: Secondary | ICD-10-CM

## 2014-11-09 DIAGNOSIS — Z3482 Encounter for supervision of other normal pregnancy, second trimester: Secondary | ICD-10-CM

## 2014-11-09 DIAGNOSIS — O09892 Supervision of other high risk pregnancies, second trimester: Secondary | ICD-10-CM | POA: Diagnosis not present

## 2014-11-09 DIAGNOSIS — Z1389 Encounter for screening for other disorder: Secondary | ICD-10-CM

## 2014-11-09 DIAGNOSIS — O99322 Drug use complicating pregnancy, second trimester: Secondary | ICD-10-CM | POA: Diagnosis not present

## 2014-11-09 LAB — POCT URINALYSIS DIPSTICK
Blood, UA: NEGATIVE
Glucose, UA: NEGATIVE
Leukocytes, UA: NEGATIVE
NITRITE UA: NEGATIVE
PROTEIN UA: NEGATIVE

## 2014-11-09 MED ORDER — OXYCODONE HCL 5 MG PO CAPS: 5.0000 mg | ORAL_CAPSULE | Freq: Three times a day (TID) | ORAL | Status: DC

## 2014-11-09 NOTE — Progress Notes (Signed)
Pt is here to discuss her illicit use of oxycodone over the past several years Takes about 5 5/325 per day down from 10-15 No prescription obtains them off the street Understands she has addiction issue not a primary pain issue now discussed at length weaning of the baby and use contract during pregnancy  Will start at 3 per day and be seen weekly Cannot get more than 1 week at a time No excepitons no other prescriptions to be given  Follow up in 1 week for management

## 2014-11-10 ENCOUNTER — Encounter: Payer: Medicaid Other | Admitting: Obstetrics & Gynecology

## 2014-11-16 ENCOUNTER — Other Ambulatory Visit: Payer: Medicaid Other

## 2014-11-16 ENCOUNTER — Encounter: Payer: Medicaid Other | Admitting: Obstetrics & Gynecology

## 2014-11-17 ENCOUNTER — Ambulatory Visit: Payer: Medicaid Other

## 2014-11-17 ENCOUNTER — Encounter: Payer: Medicaid Other | Admitting: Obstetrics & Gynecology

## 2014-12-23 NOTE — Progress Notes (Signed)
This encounter was created in error - please disregard.

## 2015-01-18 ENCOUNTER — Ambulatory Visit (INDEPENDENT_AMBULATORY_CARE_PROVIDER_SITE_OTHER): Payer: Self-pay | Admitting: Family Medicine

## 2015-01-18 ENCOUNTER — Encounter (HOSPITAL_COMMUNITY): Payer: Self-pay

## 2015-01-18 ENCOUNTER — Inpatient Hospital Stay (HOSPITAL_COMMUNITY)
Admission: AD | Admit: 2015-01-18 | Discharge: 2015-01-18 | Disposition: A | Discharge status: Home / Self Care | Payer: Medicaid Other | Source: Ambulatory Visit | Attending: Obstetrics & Gynecology | Admitting: Obstetrics & Gynecology

## 2015-01-18 ENCOUNTER — Encounter: Payer: Self-pay | Admitting: Family Medicine

## 2015-01-18 VITALS — BP 114/75 | HR 101 | Wt 122.7 lb

## 2015-01-18 DIAGNOSIS — F17210 Nicotine dependence, cigarettes, uncomplicated: Secondary | ICD-10-CM | POA: Diagnosis not present

## 2015-01-18 DIAGNOSIS — O360121 Maternal care for anti-D [Rh] antibodies, second trimester, fetus 1: Secondary | ICD-10-CM

## 2015-01-18 DIAGNOSIS — Z3A28 28 weeks gestation of pregnancy: Secondary | ICD-10-CM

## 2015-01-18 DIAGNOSIS — O09299 Supervision of pregnancy with other poor reproductive or obstetric history, unspecified trimester: Secondary | ICD-10-CM

## 2015-01-18 DIAGNOSIS — IMO0002 Reserved for concepts with insufficient information to code with codable children: Secondary | ICD-10-CM

## 2015-01-18 DIAGNOSIS — O09213 Supervision of pregnancy with history of pre-term labor, third trimester: Secondary | ICD-10-CM

## 2015-01-18 DIAGNOSIS — O99323 Drug use complicating pregnancy, third trimester: Secondary | ICD-10-CM | POA: Diagnosis not present

## 2015-01-18 DIAGNOSIS — O99333 Smoking (tobacco) complicating pregnancy, third trimester: Secondary | ICD-10-CM

## 2015-01-18 DIAGNOSIS — O09219 Supervision of pregnancy with history of pre-term labor, unspecified trimester: Secondary | ICD-10-CM

## 2015-01-18 DIAGNOSIS — O99320 Drug use complicating pregnancy, unspecified trimester: Secondary | ICD-10-CM

## 2015-01-18 DIAGNOSIS — F1920 Other psychoactive substance dependence, uncomplicated: Secondary | ICD-10-CM | POA: Diagnosis not present

## 2015-01-18 DIAGNOSIS — F1910 Other psychoactive substance abuse, uncomplicated: Secondary | ICD-10-CM

## 2015-01-18 DIAGNOSIS — F1490 Cocaine use, unspecified, uncomplicated: Secondary | ICD-10-CM

## 2015-01-18 DIAGNOSIS — F1990 Other psychoactive substance use, unspecified, uncomplicated: Secondary | ICD-10-CM

## 2015-01-18 DIAGNOSIS — Z880 Allergy status to penicillin: Secondary | ICD-10-CM | POA: Diagnosis not present

## 2015-01-18 DIAGNOSIS — O09293 Supervision of pregnancy with other poor reproductive or obstetric history, third trimester: Secondary | ICD-10-CM | POA: Diagnosis not present

## 2015-01-18 DIAGNOSIS — O09893 Supervision of other high risk pregnancies, third trimester: Secondary | ICD-10-CM | POA: Diagnosis not present

## 2015-01-18 DIAGNOSIS — O09899 Supervision of other high risk pregnancies, unspecified trimester: Secondary | ICD-10-CM | POA: Insufficient documentation

## 2015-01-18 MED ORDER — RHO D IMMUNE GLOBULIN 1500 UNIT/2ML IJ SOSY
300.0000 ug | PREFILLED_SYRINGE | Freq: Once | INTRAMUSCULAR | Status: AC
  Administered 2015-01-18: 300 ug via INTRAMUSCULAR

## 2015-01-18 MED ORDER — OXYCODONE-ACETAMINOPHEN 5-325 MG PO TABS: 1.0000 | ORAL_TABLET | Freq: Three times a day (TID) | ORAL | Status: DC | PRN

## 2015-01-18 NOTE — MAU Note (Signed)
Sent up from clinic, FH was fast.

## 2015-01-18 NOTE — Discharge Instructions (Signed)
Opioid Withdrawal °Opioids are a group of narcotic drugs. They include the street drug heroin. They also include pain medicines, such as morphine, hydrocodone, oxycodone, and fentanyl. Opioid withdrawal is a group of characteristic physical and mental signs and symptoms. It typically occurs if you have been using opioids daily for several weeks or longer and stop using or rapidly decrease use. Opioid withdrawal can also occur if you have used opioids daily for a long time and are given a medicine to block the effect.  °SIGNS AND SYMPTOMS °Opioid withdrawal includes three or more of the following symptoms:  °· Depressed, anxious, or irritable mood. °· Nausea or vomiting. °· Muscle aches or spasms.   °· Watery eyes.    °· Runny nose. °· Dilated pupils, sweating, or hairs standing on end. °· Diarrhea or intestinal cramping. °· Yawning.   °· Fever. °· Increased blood pressure. °· Fast pulse. °· Restlessness or trouble sleeping. °These signs and symptoms occur within several hours of stopping or reducing short-acting opioids, such as heroin. They can occur within 3 days of stopping or reducing long-acting opioids, such as methadone. Withdrawal begins within minutes of receiving a drug that blocks the effects of opioids, such as naltrexone or naloxone. °DIAGNOSIS  °Opioid use disorder is diagnosed by your health care provider. You will be asked about your symptoms, drug and alcohol use, medical history, and use of medicines. A physical exam may be done. Lab tests may be ordered. Your health care provider may have you see a mental health professional.  °TREATMENT  °The treatment for opioid withdrawal is usually provided by medical doctors with special training in substance use disorders (addiction specialists). The following medicines may be included in treatment: °· Opioids given in place of the abused opioid. They turn on opioid receptors in the brain and lessen or prevent withdrawal symptoms. They are gradually  decreased (opioid substitution and taper). °· Non-opioids that can lessen certain opioid withdrawal symptoms. They may be used alone or with opioid substitution and taper. °Successful long-term recovery usually requires medicine, counseling, and group support. °HOME CARE INSTRUCTIONS  °· Take medicines only as directed by your health care provider. °· Check with your health care provider before starting new medicines. °· Keep all follow-up visits as directed by your health care provider. °SEEK MEDICAL CARE IF: °· You are not able to take your medicines as directed. °· Your symptoms get worse. °· You relapse. °SEEK IMMEDIATE MEDICAL CARE IF: °· You have serious thoughts about hurting yourself or others. °· You have a seizure. °· You lose consciousness. °Document Released: 10/12/2003 Document Revised: 02/23/2014 Document Reviewed: 10/22/2013 °ExitCare® Patient Information ©2015 [employer]. This information is not intended to replace advice given to you by your health care provider. Make sure you discuss any questions you have with your health care provider. ° °Polysubstance Abuse °When people abuse more than one drug or type of drug it is called polysubstance or polydrug abuse. For example, many smokers also drink alcohol. This is one form of polydrug abuse. Polydrug abuse also refers to the use of a drug to counteract an unpleasant effect produced by another drug. It may also be used to help with withdrawal from another drug. People who take stimulants may become agitated. Sometimes this agitation is countered with a tranquilizer. This helps protect against the unpleasant side effects. Polydrug abuse also refers to the use of different drugs at the same time.  °Anytime drug use is interfering with normal living activities, it has become abuse. This includes problems   family and friends. Psychological dependence has developed when your mind tells you that the drug is needed. This is usually followed by physical  dependence which has developed when continuing increases of drug are required to get the same feeling or "high". This is known as addiction or chemical dependency. A person's risk is much higher if there is a history of chemical dependency in the family. SIGNS OF CHEMICAL DEPENDENCY  You have been told by friends or family that drugs have become a problem.  You fight when using drugs.  You are having blackouts (not remembering what you do while using).  You feel sick from using drugs but continue using.  You lie about use or amounts of drugs (chemicals) used.  You need chemicals to get you going.  You are suffering in work performance or in school because of drug use.  You get sick from use of drugs but continue to use anyway.  You need drugs to relate to people or feel comfortable in social situations.  You use drugs to forget problems. "Yes" answered to any of the above signs of chemical dependency indicates there are problems. The longer the use of drugs continues, the greater the problems will become. If there is a family history of drug or alcohol use, it is best not to experiment with these drugs. Continual use leads to tolerance. After tolerance develops more of the drug is needed to get the same feeling. This is followed by addiction. With addiction, drugs become the most important part of life. It becomes more important to take drugs than participate in the other usual activities of life. This includes relating to friends and family. Addiction is followed by dependency. Dependency is a condition where drugs are now needed not just to get high, but to feel normal. Addiction cannot be cured but it can be stopped. This often requires outside help and the care of professionals. Treatment centers are listed in the yellow pages under: Cocaine, Narcotics, and Alcoholics Anonymous. Most hospitals and clinics can refer you to a specialized care center. Talk to your caregiver if you need  help. Document Released: 05/31/2005 Document Revised: 01/01/2012 Document Reviewed: 10/09/2005 [facility] Patient Information 2015 [employer] [place]. This information is not intended to replace advice given to you by your health care provider. Make sure you discuss any questions you have with your health care provider.

## 2015-01-18 NOTE — MAU Provider Note (Signed)
  History     CSN: [id number]  Arrival date and time: 01/18/15 1648   First Provider Initiated Contact with Patient 01/18/15 1730      Chief Complaint  Patient presents with  . ? fetal tach    HPI  Laurie Norris is a 24 y.o. [phone] at 3923w3d who was sent from the clinic for a NST. FHT with doppler in the clinic was in the 180s. Patient states that the fetus has been very active. She denies any abdominal pain or vaginal bleeding. She denies any recent cocaine use. She states that she has not had any OxyContin available, and feels like she is having withdrawal from that. She was given a new RX today in the clinic.   Past Medical History  Diagnosis Date  . Scoliosis   . Chronic back pain 10/26/2014  . History of narcotic use 10/26/2014  . Pregnant 10/26/2014    Past Surgical History  Procedure Laterality Date  . Knee surgery    . Leg surgery from trauma      Family History  Problem Relation Age of Onset  . Diabetes Brother   . COPD Maternal Grandmother   . Arthritis Maternal Grandfather     History  Substance Use Topics  . Smoking status: Current Every Day Smoker -- 1.00 packs/day for 4 years    Types: Cigarettes  . Smokeless tobacco: Never Used  . Alcohol Use: No    Allergies:  Allergies  Allergen Reactions  . Penicillins Shortness Of Breath    Prescriptions prior to admission  Medication Sig Dispense Refill Last Dose  . flintstones complete (FLINTSTONES) 60 MG chewable tablet Chew 1 tablet by mouth daily. Takes 2 daily.   01/18/2015 at Unknown time  . oxyCODONE-acetaminophen (PERCOCET/ROXICET) 5-325 MG per tablet Take 1 tablet by mouth every 8 (eight) hours as needed for severe pain. 42 tablet 0 Past Week at Unknown time  . oxycodone (OXY-IR) 5 MG capsule Take 1 capsule (5 mg total) by mouth 3 (three) times daily. (Patient not taking: Reported on 01/18/2015) 21 capsule 0 Not Taking    Review of Systems  Constitutional: Negative for fever.  Gastrointestinal:  Negative for abdominal pain.  Genitourinary: Negative for dysuria.   Physical Exam   Blood pressure 108/58, pulse 119, temperature 98.3 F (36.8 C), temperature source Oral, resp. rate 18, last menstrual period 08/23/2014.  Physical Exam  Nursing note and vitals reviewed. Constitutional: She is oriented to person, place, and time. She appears well-developed and well-nourished. No distress.  Cardiovascular: Normal rate.   Respiratory: Effort normal.  GI: Soft. There is no tenderness.  Neurological: She is alert and oriented to person, place, and time.  Skin: Skin is warm and dry.  Psychiatric: She has a normal mood and affect.   FHT 155, moderate with 15x15 accels, no decels Toco: no UCs Reactive for 28 weeks  MAU Course  Procedures  MDM NST tracing reviewed   Assessment and Plan  Fetal tachycardia - Plan: Discharge patient  FHR tracing here in MAU is reactive Third trimester precautions reviewed Return to MAU as needed FU with the office as planned  Follow-up Information    [facility]   Specialty:  Obstetrics and Gynecology   Why:  As scheduled   Contact information:   [address] [place] [id number] [phone]       Tawnya CrookHogan, Antania Hoefling Donovan 01/18/2015, 5:33 PM
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
## 2015-01-18 NOTE — Progress Notes (Signed)
Pt has 3 children. One was born around 30 weeks per patient her water broke and she started bleeding. Needs Rhogam.

## 2015-01-19 ENCOUNTER — Telehealth: Payer: Self-pay | Admitting: *Deleted

## 2015-01-19 NOTE — Telephone Encounter (Signed)
Called patient and left detailed message about her ultrasound appointment and to come to clinic after the ultrasound for her 1hr.

## 2015-01-19 NOTE — Telephone Encounter (Signed)
Called patient again and her boyfriend answered and stated that patient is in the shower and she will call me back in 5 minutes. Advised him to have her ask to speak directly with me.

## 2015-01-19 NOTE — Telephone Encounter (Signed)
Pt left clinic yesterday before having ultrasound scheduled. She requested that we do it same day as her lab. Ultrasound scheduled for 3/30 at 1015. Tamela OddiBetsy okay with patient coming for labs after the ultrasound is complete. Called patient and left message to call us back for important appointment information. Will try again later.

## 2015-01-20 ENCOUNTER — Other Ambulatory Visit: Payer: Medicaid Other

## 2015-01-20 ENCOUNTER — Ambulatory Visit (HOSPITAL_COMMUNITY): Payer: Medicaid Other

## 2015-01-20 DIAGNOSIS — O99320 Drug use complicating pregnancy, unspecified trimester: Secondary | ICD-10-CM

## 2015-01-20 DIAGNOSIS — F1920 Other psychoactive substance dependence, uncomplicated: Secondary | ICD-10-CM | POA: Insufficient documentation

## 2015-01-25 ENCOUNTER — Other Ambulatory Visit: Payer: Medicaid Other

## 2015-01-27 ENCOUNTER — Encounter: Payer: Self-pay | Admitting: *Deleted

## 2015-01-27 ENCOUNTER — Other Ambulatory Visit: Payer: Self-pay | Admitting: Family Medicine

## 2015-01-27 ENCOUNTER — Other Ambulatory Visit: Payer: Medicaid Other

## 2015-01-27 ENCOUNTER — Ambulatory Visit (HOSPITAL_COMMUNITY)
Admission: RE | Admit: 2015-01-27 | Discharge: 2015-01-27 | Disposition: A | Discharge status: Home / Self Care | Payer: Medicaid Other | Source: Ambulatory Visit | Attending: Family Medicine | Admitting: Family Medicine

## 2015-01-27 DIAGNOSIS — F1210 Cannabis abuse, uncomplicated: Secondary | ICD-10-CM | POA: Diagnosis not present

## 2015-01-27 DIAGNOSIS — O99323 Drug use complicating pregnancy, third trimester: Secondary | ICD-10-CM | POA: Insufficient documentation

## 2015-01-27 DIAGNOSIS — F1410 Cocaine abuse, uncomplicated: Secondary | ICD-10-CM | POA: Diagnosis not present

## 2015-01-27 DIAGNOSIS — F1910 Other psychoactive substance abuse, uncomplicated: Principal | ICD-10-CM

## 2015-01-27 DIAGNOSIS — O99320 Drug use complicating pregnancy, unspecified trimester: Secondary | ICD-10-CM

## 2015-01-27 DIAGNOSIS — Z36 Encounter for antenatal screening of mother: Secondary | ICD-10-CM | POA: Insufficient documentation

## 2015-01-27 DIAGNOSIS — Z3A29 29 weeks gestation of pregnancy: Secondary | ICD-10-CM | POA: Diagnosis not present

## 2015-01-27 DIAGNOSIS — Z3493 Encounter for supervision of normal pregnancy, unspecified, third trimester: Secondary | ICD-10-CM

## 2015-01-27 LAB — CBC
HEMATOCRIT: 28 % — AB (ref 36.0–46.0)
Hemoglobin: 9.4 g/dL — ABNORMAL LOW (ref 12.0–15.0)
MCH: 30.7 pg (ref 26.0–34.0)
MCHC: 33.6 g/dL (ref 30.0–36.0)
MCV: 91.5 fL (ref 78.0–100.0)
MPV: 9.4 fL (ref 8.6–12.4)
Platelets: 321 10*3/uL (ref 150–400)
RBC: 3.06 MIL/uL — ABNORMAL LOW (ref 3.87–5.11)
RDW: 12.9 % (ref 11.5–15.5)
WBC: 9.5 10*3/uL (ref 4.0–10.5)

## 2015-01-28 LAB — RPR

## 2015-01-28 LAB — GLUCOSE TOLERANCE, 1 HOUR (50G) W/O FASTING: Glucose, 1 Hour GTT: 90 mg/dL (ref 70–140)

## 2015-01-28 LAB — CULTURE, OB URINE
Colony Count: NO GROWTH
ORGANISM ID, BACTERIA: NO GROWTH

## 2015-01-28 LAB — HIV ANTIBODY (ROUTINE TESTING W REFLEX): HIV 1&2 Ab, 4th Generation: NONREACTIVE

## 2015-02-01 LAB — OXYCODONE, URINE (LC/MS-MS)
NOROXYCODONE, UR: 2074 ng/mL — AB (ref <50)
Oxycodone, ur: 92 ng/mL — AB (ref <50)
Oxymorphone: 612 ng/mL — AB (ref <50)

## 2015-02-01 LAB — COCAINE METABOLITE (GC/LC/MS), URINE: BENZOYLECGONINE GC/MS CONF: 2497 ng/mL — AB (ref <100)

## 2015-02-02 LAB — PRESCRIPTION MONITORING PROFILE (19 PANEL)
AMPHETAMINE/METH: NEGATIVE ng/mL
BUPRENORPHINE, URINE: NEGATIVE ng/mL
Barbiturate Screen, Urine: NEGATIVE ng/mL
Benzodiazepine Screen, Urine: NEGATIVE ng/mL
CANNABINOID SCRN UR: NEGATIVE ng/mL
CARISOPRODOL, URINE: NEGATIVE ng/mL
Creatinine, Urine: 155.53 mg/dL (ref >20.0)
Fentanyl, Ur: NEGATIVE ng/mL
MDMA URINE: NEGATIVE ng/mL
METHAQUALONE SCREEN (URINE): NEGATIVE ng/mL
Meperidine, Ur: NEGATIVE ng/mL
Methadone Screen, Urine: NEGATIVE ng/mL
Nitrites, Initial: NEGATIVE ug/mL
Opiate Screen, Urine: NEGATIVE ng/mL
PHENCYCLIDINE, UR: NEGATIVE ng/mL
Propoxyphene: NEGATIVE ng/mL
TAPENTADOLUR: NEGATIVE ng/mL
Tramadol Scrn, Ur: NEGATIVE ng/mL
ZOLPIDEM, URINE: NEGATIVE ng/mL
pH, Initial: 6.9 pH (ref 4.5–8.9)

## 2015-02-08 ENCOUNTER — Encounter: Payer: Medicaid Other | Admitting: Obstetrics and Gynecology

## 2015-02-12 ENCOUNTER — Telehealth: Payer: Self-pay | Admitting: General Practice

## 2015-02-12 NOTE — Telephone Encounter (Signed)
Patient called into front office stating she missed her appt on Monday and couldn't get another appt till Thursday and she is already out of her pain medication and doesn't know what she is going to do. Changed patient's appt to 4/25 @ 825 and informed patient. Patient verbalized understanding and had no other questions

## 2015-02-15 ENCOUNTER — Encounter: Payer: Medicaid Other | Admitting: Family Medicine

## 2015-02-18 ENCOUNTER — Encounter: Payer: Medicaid Other | Admitting: Obstetrics and Gynecology

## 2015-02-22 ENCOUNTER — Ambulatory Visit (INDEPENDENT_AMBULATORY_CARE_PROVIDER_SITE_OTHER): Payer: Medicaid Other | Admitting: Family Medicine

## 2015-02-22 ENCOUNTER — Telehealth: Payer: Self-pay | Admitting: General Practice

## 2015-02-22 VITALS — BP 111/81 | HR 101 | Wt 131.8 lb

## 2015-02-22 DIAGNOSIS — O99323 Drug use complicating pregnancy, third trimester: Secondary | ICD-10-CM

## 2015-02-22 DIAGNOSIS — F1490 Cocaine use, unspecified, uncomplicated: Secondary | ICD-10-CM

## 2015-02-22 DIAGNOSIS — O09893 Supervision of other high risk pregnancies, third trimester: Secondary | ICD-10-CM | POA: Diagnosis not present

## 2015-02-22 DIAGNOSIS — O09293 Supervision of pregnancy with other poor reproductive or obstetric history, third trimester: Secondary | ICD-10-CM | POA: Diagnosis not present

## 2015-02-22 DIAGNOSIS — O09213 Supervision of pregnancy with history of pre-term labor, third trimester: Secondary | ICD-10-CM

## 2015-02-22 DIAGNOSIS — F1910 Other psychoactive substance abuse, uncomplicated: Principal | ICD-10-CM

## 2015-02-22 DIAGNOSIS — F1920 Other psychoactive substance dependence, uncomplicated: Secondary | ICD-10-CM | POA: Diagnosis not present

## 2015-02-22 DIAGNOSIS — O99320 Drug use complicating pregnancy, unspecified trimester: Secondary | ICD-10-CM

## 2015-02-22 MED ORDER — OXYCODONE-ACETAMINOPHEN 5-325 MG PO TABS: 1.0000 | ORAL_TABLET | Freq: Three times a day (TID) | ORAL | Status: DC | PRN

## 2015-02-22 NOTE — Telephone Encounter (Signed)
-----   Message from Fredirick LatheKristy Acosta, MD sent at 02/22/2015  1:27 PM EDT ----- Forgot to refer this pt to SW today for BTL consents, can you please have her come back to sign her consents  Perry MountACOSTA,KRISTY ROCIO, MD
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
## 2015-02-22 NOTE — Progress Notes (Signed)
U/S 02/25/15 @ 345p with Radiology.

## 2015-02-22 NOTE — Progress Notes (Signed)
Pt is concerned about veins in legs/problems with teeth.

## 2015-02-22 NOTE — Telephone Encounter (Signed)
Called patient stating we are calling because we forgot to have her sign her BTL consent today and wondered if she could come by Thursday before her ultrasound. Patient states yes she will come by then. Patient had no questions

## 2015-02-25 ENCOUNTER — Ambulatory Visit (HOSPITAL_COMMUNITY)
Admission: RE | Admit: 2015-02-25 | Discharge: 2015-02-25 | Disposition: A | Discharge status: Home / Self Care | Payer: Medicaid Other | Source: Ambulatory Visit | Attending: Family Medicine | Admitting: Family Medicine

## 2015-02-25 ENCOUNTER — Encounter: Payer: Self-pay | Admitting: General Practice

## 2015-02-25 DIAGNOSIS — O99320 Drug use complicating pregnancy, unspecified trimester: Secondary | ICD-10-CM

## 2015-02-25 DIAGNOSIS — F1910 Other psychoactive substance abuse, uncomplicated: Secondary | ICD-10-CM

## 2015-02-25 DIAGNOSIS — Z3A33 33 weeks gestation of pregnancy: Secondary | ICD-10-CM | POA: Insufficient documentation

## 2015-02-25 DIAGNOSIS — O99323 Drug use complicating pregnancy, third trimester: Secondary | ICD-10-CM | POA: Insufficient documentation

## 2015-02-25 DIAGNOSIS — Z36 Encounter for antenatal screening of mother: Secondary | ICD-10-CM | POA: Insufficient documentation

## 2015-02-25 DIAGNOSIS — F1410 Cocaine abuse, uncomplicated: Secondary | ICD-10-CM | POA: Diagnosis not present

## 2015-02-25 NOTE — Progress Notes (Signed)
Patient came by clinic today to sign BTL papers

## 2015-03-01 ENCOUNTER — Telehealth: Payer: Self-pay | Admitting: Obstetrics and Gynecology

## 2015-03-01 ENCOUNTER — Encounter: Payer: Medicaid Other | Admitting: Obstetrics and Gynecology

## 2015-03-01 NOTE — Telephone Encounter (Signed)
Called patient and left a message to call us about her appointment she missed on today. Need to reschedule her OB appointment. °

## 2015-03-08 ENCOUNTER — Encounter: Payer: Medicaid Other | Admitting: Obstetrics & Gynecology

## 2015-03-08 ENCOUNTER — Encounter: Payer: Self-pay | Admitting: Obstetrics & Gynecology

## 2015-03-29 ENCOUNTER — Ambulatory Visit (INDEPENDENT_AMBULATORY_CARE_PROVIDER_SITE_OTHER): Payer: Medicaid Other | Admitting: Obstetrics and Gynecology

## 2015-03-29 VITALS — BP 107/60 | HR 91 | Wt 144.4 lb

## 2015-03-29 DIAGNOSIS — O99323 Drug use complicating pregnancy, third trimester: Secondary | ICD-10-CM | POA: Diagnosis not present

## 2015-03-29 DIAGNOSIS — O09293 Supervision of pregnancy with other poor reproductive or obstetric history, third trimester: Secondary | ICD-10-CM

## 2015-03-29 DIAGNOSIS — F1920 Other psychoactive substance dependence, uncomplicated: Secondary | ICD-10-CM | POA: Diagnosis not present

## 2015-03-29 DIAGNOSIS — F1910 Other psychoactive substance abuse, uncomplicated: Secondary | ICD-10-CM

## 2015-03-29 DIAGNOSIS — O99320 Drug use complicating pregnancy, unspecified trimester: Secondary | ICD-10-CM

## 2015-03-29 DIAGNOSIS — O09213 Supervision of pregnancy with history of pre-term labor, third trimester: Secondary | ICD-10-CM | POA: Diagnosis not present

## 2015-03-29 DIAGNOSIS — Z23 Encounter for immunization: Secondary | ICD-10-CM | POA: Diagnosis not present

## 2015-03-29 DIAGNOSIS — O09893 Supervision of other high risk pregnancies, third trimester: Secondary | ICD-10-CM

## 2015-03-29 LAB — POCT URINALYSIS DIP (DEVICE)
Bilirubin Urine: NEGATIVE
GLUCOSE, UA: NEGATIVE mg/dL
HGB URINE DIPSTICK: NEGATIVE
Ketones, ur: NEGATIVE mg/dL
Nitrite: NEGATIVE
PH: 7 (ref 5.0–8.0)
Protein, ur: NEGATIVE mg/dL
Specific Gravity, Urine: 1.015 (ref 1.005–1.030)
Urobilinogen, UA: 0.2 mg/dL (ref 0.0–1.0)

## 2015-03-29 LAB — OB RESULTS CONSOLE GC/CHLAMYDIA
CHLAMYDIA, DNA PROBE: NEGATIVE
GC PROBE AMP, GENITAL: NEGATIVE

## 2015-03-29 MED ORDER — TETANUS-DIPHTH-ACELL PERTUSSIS 5-2.5-18.5 LF-MCG/0.5 IM SUSP
0.5000 mL | Freq: Once | INTRAMUSCULAR | Status: AC
  Administered 2015-03-29: 0.5 mL via INTRAMUSCULAR

## 2015-03-29 MED ORDER — OXYCODONE-ACETAMINOPHEN 5-325 MG PO TABS: 1.0000 | ORAL_TABLET | Freq: Three times a day (TID) | ORAL | Status: DC | PRN

## 2015-03-29 NOTE — Progress Notes (Signed)
Subjective:   Laurie Norris is a 24 y.o. [phone] at 5519w3d being seen today for her obstetrical visit.  Patient reports no complaints.   Contractions: Contractions: Irregular.   Vaginal Bleeding Vag. Bleeding: None.   Fetal Movement: Movement: Present.   Denies contractions, vaginal bleeding or leaking of fluid.  Reports good fetal movement.  The following portions of the patient's history were reviewed and updated as appropriate: allergies, current medications, past family history, past medical history, past social history, past surgical history and problem list.   Objective:  BP 107/60 mmHg  Pulse 91  Wt 144 lb 6.4 oz (65.499 kg)  LMP 08/23/2014 Fetal Heart Rate: Fetal Heart Rate (bpm): 120  Fundal Height:    Fetal Movement: Movement: Present  Fetal Presentation:     Abdomen: Soft, gravid, appropriate for gestational age.  Pain/Pressure: Pain/Pressure: Present     Vaginal: Vaginal Bleeding Vag. Bleeding: None   Discharge:    Cervix: Dilation:   Effacement:   Station:    Extremities: Edema: Edema: Trace   Urinalysis: Protein: Urine Protein: Negative Glucose: Urine Glucose: Negative No results found for this or any previous visit (from the past 24 hour(s)).   Assessment and Plan:   Pregnancy:  [id number] at 6419w3d  1. Current pregnancy with history of preterm labor, third trimester Routine PNC.  36 week labs today.  - GC/Chlamydia Probe Amp - Culture, beta strep (group b only) - Flu Vaccine QUAD 36+ mos IM; Standing - Tdap (BOOSTRIX) injection 0.5 mL; Inject 0.5 mLs into the muscle once. - Flu Vaccine QUAD 36+ mos IM  2. Supervision of other high-risk pregnancy, third trimester Routine PNC  3. History of preterm delivery, currently pregnant, third trimester Presented to care here late. It was too late to start 17P.   4. Drug dependence during pregnancy, third trimester Opiate dependence. UDS +opiates, cocaine in April.  UDS today Per notes, have been Rx Percocet  5/325 TID q2wks. #42. Last seen in clinic on 5/2 and given this Rx. She states she ran out about a week ago and has been taking her mom's prescription. Last dose was last night.  Discussed with Dr. Shawnie PonsPratt and will give another 2 week supply. She will not receive this medication postpartum.  Will need NAS referral. Placed today. - Prescription Monitoring Profile (17)-[facility]   5. H/O macrosomia in infant in prior pregnancy, currently pregnant, third trimester   Preterm labor symptoms: vaginal bleeding, contractions and leaking of fluid reviewed in detail.  Fetal movement precautions reviewed.  Follow up in 1 week.   William DaltonMorgan Laurie Defino, MD

## 2015-03-29 NOTE — Progress Notes (Signed)
Called NICU and left message with Herbert SetaHeather for NAS tour. Herbert SetaHeather will call Shanda BumpsJessica with appointment.

## 2015-03-30 LAB — OB RESULTS CONSOLE GBS: GBS: NEGATIVE

## 2015-03-31 LAB — GC/CHLAMYDIA PROBE AMP
CT Probe RNA: NEGATIVE
GC PROBE AMP APTIMA: NEGATIVE

## 2015-03-31 LAB — CULTURE, BETA STREP (GROUP B ONLY)

## 2015-04-01 ENCOUNTER — Encounter (HOSPITAL_COMMUNITY): Payer: Self-pay

## 2015-04-01 NOTE — Progress Notes (Signed)
NICU NAS tour scheduled for April 05, 2015 after patient's OB appointment.

## 2015-04-02 LAB — BUPRENORPHINES (GC/LC/MS), URINE
BUPRENORPHINE (GC/LC/MS): 4.7 ng/mL — AB (ref <2)
Norbuprenorphine: 36.5 ng/mL — AB (ref <2)

## 2015-04-03 LAB — PRESCRIPTION MONITORING PROFILE (SOLSTAS)
Amphetamine/Meth: NEGATIVE ng/mL
BENZODIAZEPINE SCREEN, URINE: NEGATIVE ng/mL
Barbiturate Screen, Urine: NEGATIVE ng/mL
CANNABINOID SCRN UR: NEGATIVE ng/mL
Carisoprodol, Urine: NEGATIVE ng/mL
Cocaine Metabolites: NEGATIVE ng/mL
Creatinine, Urine: 41.68 mg/dL (ref >20.0)
Fentanyl, Ur: NEGATIVE ng/mL
MDMA URINE: NEGATIVE ng/mL
Meperidine, Ur: NEGATIVE ng/mL
Methadone Screen, Urine: NEGATIVE ng/mL
Nitrites, Initial: NEGATIVE ug/mL
Opiate Screen, Urine: NEGATIVE ng/mL
Oxycodone Screen, Ur: NEGATIVE ng/mL
PH URINE, INITIAL: 6.6 pH (ref 4.5–8.9)
Propoxyphene: NEGATIVE ng/mL
TRAMADOL UR: NEGATIVE ng/mL
Tapentadol, urine: NEGATIVE ng/mL
ZOLPIDEM, URINE: NEGATIVE ng/mL

## 2015-04-05 ENCOUNTER — Encounter: Payer: Medicaid Other | Admitting: Obstetrics & Gynecology

## 2015-04-08 ENCOUNTER — Encounter (HOSPITAL_COMMUNITY): Payer: Self-pay

## 2015-04-08 NOTE — Progress Notes (Signed)
Patient did not show for NICU NAS consult on 04/05/15 as scheduled. Please call [phone] to reschedule consult.

## 2015-04-14 ENCOUNTER — Encounter (HOSPITAL_COMMUNITY): Payer: Self-pay | Admitting: *Deleted

## 2015-04-14 ENCOUNTER — Inpatient Hospital Stay (HOSPITAL_COMMUNITY)
Admission: AD | Admit: 2015-04-14 | Discharge: 2015-04-14 | Disposition: A | Discharge status: Home / Self Care | Payer: Medicaid Other | Source: Ambulatory Visit | Attending: Obstetrics & Gynecology | Admitting: Obstetrics & Gynecology

## 2015-04-14 ENCOUNTER — Telehealth (HOSPITAL_COMMUNITY): Payer: Self-pay | Admitting: *Deleted

## 2015-04-14 ENCOUNTER — Ambulatory Visit (INDEPENDENT_AMBULATORY_CARE_PROVIDER_SITE_OTHER): Payer: Medicaid Other | Admitting: Advanced Practice Midwife

## 2015-04-14 VITALS — BP 126/74 | HR 98 | Temp 98.2°F | Wt 144.6 lb

## 2015-04-14 DIAGNOSIS — O360121 Maternal care for anti-D [Rh] antibodies, second trimester, fetus 1: Secondary | ICD-10-CM

## 2015-04-14 DIAGNOSIS — Z6791 Unspecified blood type, Rh negative: Secondary | ICD-10-CM | POA: Insufficient documentation

## 2015-04-14 DIAGNOSIS — O09893 Supervision of other high risk pregnancies, third trimester: Secondary | ICD-10-CM | POA: Diagnosis not present

## 2015-04-14 DIAGNOSIS — O09213 Supervision of pregnancy with history of pre-term labor, third trimester: Secondary | ICD-10-CM

## 2015-04-14 DIAGNOSIS — F1920 Other psychoactive substance dependence, uncomplicated: Secondary | ICD-10-CM

## 2015-04-14 DIAGNOSIS — O99323 Drug use complicating pregnancy, third trimester: Secondary | ICD-10-CM | POA: Insufficient documentation

## 2015-04-14 DIAGNOSIS — Z3A40 40 weeks gestation of pregnancy: Secondary | ICD-10-CM | POA: Insufficient documentation

## 2015-04-14 DIAGNOSIS — O26893 Other specified pregnancy related conditions, third trimester: Secondary | ICD-10-CM | POA: Insufficient documentation

## 2015-04-14 DIAGNOSIS — O09293 Supervision of pregnancy with other poor reproductive or obstetric history, third trimester: Secondary | ICD-10-CM | POA: Diagnosis not present

## 2015-04-14 DIAGNOSIS — F1490 Cocaine use, unspecified, uncomplicated: Secondary | ICD-10-CM

## 2015-04-14 LAB — POCT URINALYSIS DIP (DEVICE)
Bilirubin Urine: NEGATIVE
Glucose, UA: NEGATIVE mg/dL
Hgb urine dipstick: NEGATIVE
KETONES UR: NEGATIVE mg/dL
LEUKOCYTES UA: NEGATIVE
NITRITE: NEGATIVE
PROTEIN: NEGATIVE mg/dL
Specific Gravity, Urine: 1.015 (ref 1.005–1.030)
UROBILINOGEN UA: 0.2 mg/dL (ref 0.0–1.0)
pH: 7 (ref 5.0–8.0)

## 2015-04-14 NOTE — Telephone Encounter (Signed)
Preadmission screen  

## 2015-04-14 NOTE — MAU Note (Signed)
Urine in lab 

## 2015-04-14 NOTE — Progress Notes (Signed)
Induction scheduled for 04/16/15 at 0730.

## 2015-04-14 NOTE — MAU Note (Signed)
Pt to MAU for NST

## 2015-04-14 NOTE — Progress Notes (Signed)
I was present for the exam and agree with above.  [place], [place] 04/14/2015 10:37 PM

## 2015-04-14 NOTE — Patient Instructions (Signed)
Labor Induction  Labor induction is when steps are taken to cause a pregnant woman to begin the labor process. Most women go into labor on their own between 37 weeks and 42 weeks of the pregnancy. When this does not happen or when there is a medical need, methods may be used to induce labor. Labor induction causes a pregnant woman's uterus to contract. It also causes the cervix to soften (ripen), open (dilate), and thin out (efface). Usually, labor is not induced before 39 weeks of the pregnancy unless there is a problem with the baby or mother.  Before inducing labor, your health care provider will consider a number of factors, including the following:  The medical condition of you and the baby.   How many weeks along you are.   The status of the baby's lung maturity.   The condition of the cervix.   The position of the baby.  WHAT ARE THE REASONS FOR LABOR INDUCTION? Labor may be induced for the following reasons:  The health of the baby or mother is at risk.   The pregnancy is overdue by 1 week or more.   The water breaks but labor does not start on its own.   The mother has a health condition or serious illness, such as high blood pressure, infection, placental abruption, or diabetes.  The amniotic fluid amounts are low around the baby.   The baby is distressed.  Convenience or wanting the baby to be born on a certain date is not a reason for inducing labor. WHAT METHODS ARE USED FOR LABOR INDUCTION? Several methods of labor induction may be used, such as:   Prostaglandin medicine. This medicine causes the cervix to dilate and ripen. The medicine will also start contractions. It can be taken by mouth or by inserting a suppository into the vagina.   Inserting a thin tube (catheter) with a balloon on the end into the vagina to dilate the cervix. Once inserted, the balloon is expanded with water, which causes the cervix to open.   Stripping the membranes. Your health  care provider separates amniotic sac tissue from the cervix, causing the cervix to be stretched and causing the release of a hormone called progesterone. This may cause the uterus to contract. It is often done during an office visit. You will be sent home to wait for the contractions to begin. You will then come in for an induction.   Breaking the water. Your health care provider makes a hole in the amniotic sac using a small instrument. Once the amniotic sac breaks, contractions should begin. This may still take hours to see an effect.   Medicine to trigger or strengthen contractions. This medicine is given through an IV access tube inserted into a vein in your arm.  All of the methods of induction, besides stripping the membranes, will be done in the hospital. Induction is done in the hospital so that you and the baby can be carefully monitored.  HOW LONG DOES IT TAKE FOR LABOR TO BE INDUCED? Some inductions can take up to 2-3 days. Depending on the cervix, it usually takes less time. It takes longer when you are induced early in the pregnancy or if this is your first pregnancy. If a mother is still pregnant and the induction has been going on for 2-3 days, either the mother will be sent home or a cesarean delivery will be needed. WHAT ARE THE RISKS ASSOCIATED WITH LABOR INDUCTION? Some of the risks of induction   include:   Changes in fetal heart rate, such as too high, too low, or erratic.   Fetal distress.   Chance of infection for the mother and baby.   Increased chance of having a cesarean delivery.   Breaking off (abruption) of the placenta from the uterus (rare).   Uterine rupture (very rare).  When induction is needed for medical reasons, the benefits of induction may outweigh the risks. WHAT ARE SOME REASONS FOR NOT INDUCING LABOR? Labor induction should not be done if:   It is shown that your baby does not tolerate labor.   You have had previous surgeries on your  uterus, such as a myomectomy or the removal of fibroids.   Your placenta lies very low in the uterus and blocks the opening of the cervix (placenta previa).   Your baby is not in a head-down position.   The umbilical cord drops down into the birth canal in front of the baby. This could cut off the baby's blood and oxygen supply.   You have had a previous cesarean delivery.   There are unusual circumstances, such as the baby being extremely premature.  Document Released: 02/28/2007 Document Revised: 06/11/2013 Document Reviewed: 05/08/2013 ExitCare Patient Information 2015 [employer]. This information is not intended to replace advice given to you by your health care provider. Make sure you discuss any questions you have with your health care provider.  

## 2015-04-14 NOTE — Progress Notes (Signed)
Laurie Norris is a 24 y.o. [phone] at [redacted]w[redacted]d for routine follow up.  Denies regular or consistent cramping/ctx, fluid leaking, vaginal bleeding, or decreased fetal movement.  Pt reports that over the last 5 days has not taken any opioids. See flow sheet for details.  A/P: Pregnancy at [redacted]w[redacted]d.  Pregnancy issues include drug abuse (narcotics, cocaine, THC) Has missed several appointments and is now [redacted]w[redacted]d. High risk for no-showing to induction or further appointments. Given high risk situation with missing appts, drug use, multiparous pregnancy that is post dates, the best plan is for patient to be induced today. Pt will be sent to MAU for NST right now. If any concerns with NST will be admitted directly to L&D for induction. Pt strongly prefers to go home and return tonight for induction if possible. If NST reactive, patient will return tonight for induction as pt would prefer to go home and get family things in order. Pt seen along with Dorathy Kinsman CNM, who discussed plan of care with L&D attending Dr. Erin Fulling.

## 2015-04-16 ENCOUNTER — Inpatient Hospital Stay (HOSPITAL_COMMUNITY)
Admission: RE | Admit: 2015-04-16 | Discharge: 2015-04-18 | DRG: 767 | Disposition: A | Discharge status: Home / Self Care | Payer: Medicaid Other | Source: Ambulatory Visit | Attending: Obstetrics and Gynecology | Admitting: Obstetrics and Gynecology

## 2015-04-16 ENCOUNTER — Encounter (HOSPITAL_COMMUNITY): Payer: Self-pay

## 2015-04-16 VITALS — BP 101/54 | HR 57 | Temp 97.9°F | Resp 20 | Ht 64.0 in | Wt 154.0 lb

## 2015-04-16 DIAGNOSIS — O0933 Supervision of pregnancy with insufficient antenatal care, third trimester: Secondary | ICD-10-CM | POA: Diagnosis not present

## 2015-04-16 DIAGNOSIS — F1920 Other psychoactive substance dependence, uncomplicated: Secondary | ICD-10-CM

## 2015-04-16 DIAGNOSIS — Z302 Encounter for sterilization: Secondary | ICD-10-CM

## 2015-04-16 DIAGNOSIS — O0943 Supervision of pregnancy with grand multiparity, third trimester: Secondary | ICD-10-CM

## 2015-04-16 DIAGNOSIS — O360121 Maternal care for anti-D [Rh] antibodies, second trimester, fetus 1: Secondary | ICD-10-CM

## 2015-04-16 DIAGNOSIS — O99323 Drug use complicating pregnancy, third trimester: Secondary | ICD-10-CM

## 2015-04-16 DIAGNOSIS — O09293 Supervision of pregnancy with other poor reproductive or obstetric history, third trimester: Secondary | ICD-10-CM

## 2015-04-16 DIAGNOSIS — Z880 Allergy status to penicillin: Secondary | ICD-10-CM | POA: Diagnosis not present

## 2015-04-16 DIAGNOSIS — O09213 Supervision of pregnancy with history of pre-term labor, third trimester: Secondary | ICD-10-CM

## 2015-04-16 DIAGNOSIS — G8929 Other chronic pain: Secondary | ICD-10-CM | POA: Diagnosis present

## 2015-04-16 DIAGNOSIS — O480 Post-term pregnancy: Secondary | ICD-10-CM | POA: Diagnosis present

## 2015-04-16 DIAGNOSIS — F1410 Cocaine abuse, uncomplicated: Secondary | ICD-10-CM | POA: Diagnosis present

## 2015-04-16 DIAGNOSIS — F1210 Cannabis abuse, uncomplicated: Secondary | ICD-10-CM | POA: Diagnosis present

## 2015-04-16 DIAGNOSIS — O09893 Supervision of other high risk pregnancies, third trimester: Secondary | ICD-10-CM

## 2015-04-16 DIAGNOSIS — O99324 Drug use complicating childbirth: Secondary | ICD-10-CM | POA: Diagnosis present

## 2015-04-16 DIAGNOSIS — F1120 Opioid dependence, uncomplicated: Secondary | ICD-10-CM | POA: Diagnosis present

## 2015-04-16 DIAGNOSIS — O99334 Smoking (tobacco) complicating childbirth: Secondary | ICD-10-CM | POA: Diagnosis present

## 2015-04-16 DIAGNOSIS — O9989 Other specified diseases and conditions complicating pregnancy, childbirth and the puerperium: Secondary | ICD-10-CM | POA: Diagnosis present

## 2015-04-16 DIAGNOSIS — F17210 Nicotine dependence, cigarettes, uncomplicated: Secondary | ICD-10-CM | POA: Diagnosis present

## 2015-04-16 DIAGNOSIS — Z3A41 41 weeks gestation of pregnancy: Secondary | ICD-10-CM | POA: Diagnosis present

## 2015-04-16 DIAGNOSIS — O99354 Diseases of the nervous system complicating childbirth: Secondary | ICD-10-CM | POA: Diagnosis present

## 2015-04-16 DIAGNOSIS — M419 Scoliosis, unspecified: Secondary | ICD-10-CM | POA: Diagnosis present

## 2015-04-16 LAB — CBC
HEMATOCRIT: 25.3 % — AB (ref 36.0–46.0)
Hemoglobin: 8.2 g/dL — ABNORMAL LOW (ref 12.0–15.0)
MCH: 26.5 pg (ref 26.0–34.0)
MCHC: 32.4 g/dL (ref 30.0–36.0)
MCV: 81.6 fL (ref 78.0–100.0)
Platelets: 252 10*3/uL (ref 150–400)
RBC: 3.1 MIL/uL — ABNORMAL LOW (ref 3.87–5.11)
RDW: 14.5 % (ref 11.5–15.5)
WBC: 10 10*3/uL (ref 4.0–10.5)

## 2015-04-16 LAB — TYPE AND SCREEN
ABO/RH(D): A NEG
Antibody Screen: NEGATIVE

## 2015-04-16 LAB — RAPID URINE DRUG SCREEN, HOSP PERFORMED
AMPHETAMINES: NOT DETECTED
BARBITURATES: NOT DETECTED
BENZODIAZEPINES: NOT DETECTED
COCAINE: NOT DETECTED
Opiates: NOT DETECTED
Tetrahydrocannabinol: NOT DETECTED

## 2015-04-16 LAB — ABO/RH: ABO/RH(D): A NEG

## 2015-04-16 MED ORDER — CITRIC ACID-SODIUM CITRATE 334-500 MG/5ML PO SOLN: 30.0000 mL | ORAL | Status: DC | PRN

## 2015-04-16 MED ORDER — DIPHENHYDRAMINE HCL 50 MG/ML IJ SOLN: 12.5000 mg | INTRAMUSCULAR | Status: DC | PRN

## 2015-04-16 MED ORDER — LIDOCAINE HCL (PF) 1 % IJ SOLN
30.0000 mL | INTRAMUSCULAR | Status: DC | PRN
  Filled 2015-04-16: qty 30

## 2015-04-16 MED ORDER — LACTATED RINGERS IV SOLN
INTRAVENOUS | Status: DC
  Administered 2015-04-17: 01:00:00 via INTRAVENOUS

## 2015-04-16 MED ORDER — MISOPROSTOL 25 MCG QUARTER TABLET
25.0000 ug | ORAL_TABLET | ORAL | Status: DC | PRN
  Administered 2015-04-16 (×2): 25 ug via VAGINAL
  Filled 2015-04-16 (×2): qty 0.25

## 2015-04-16 MED ORDER — OXYTOCIN 40 UNITS IN LACTATED RINGERS INFUSION - SIMPLE MED: 62.5000 mL/h | INTRAVENOUS | Status: DC

## 2015-04-16 MED ORDER — TERBUTALINE SULFATE 1 MG/ML IJ SOLN: 0.2500 mg | Freq: Once | INTRAMUSCULAR | Status: DC | PRN

## 2015-04-16 MED ORDER — LACTATED RINGERS IV SOLN
500.0000 mL | INTRAVENOUS | Status: DC | PRN
  Administered 2015-04-17: 500 mL via INTRAVENOUS

## 2015-04-16 MED ORDER — OXYCODONE-ACETAMINOPHEN 5-325 MG PO TABS: 2.0000 | ORAL_TABLET | ORAL | Status: DC | PRN

## 2015-04-16 MED ORDER — EPHEDRINE 5 MG/ML INJ: 10.0000 mg | INTRAVENOUS | Status: DC | PRN

## 2015-04-16 MED ORDER — PHENYLEPHRINE 40 MCG/ML (10ML) SYRINGE FOR IV PUSH (FOR BLOOD PRESSURE SUPPORT): 80.0000 ug | PREFILLED_SYRINGE | INTRAVENOUS | Status: DC | PRN

## 2015-04-16 MED ORDER — OXYTOCIN 40 UNITS IN LACTATED RINGERS INFUSION - SIMPLE MED
1.0000 m[IU]/min | INTRAVENOUS | Status: DC
  Administered 2015-04-16: 2 m[IU]/min via INTRAVENOUS
  Filled 2015-04-16: qty 1000

## 2015-04-16 MED ORDER — OXYCODONE-ACETAMINOPHEN 5-325 MG PO TABS: 1.0000 | ORAL_TABLET | ORAL | Status: DC | PRN

## 2015-04-16 MED ORDER — ONDANSETRON HCL 4 MG/2ML IJ SOLN: 4.0000 mg | Freq: Four times a day (QID) | INTRAMUSCULAR | Status: DC | PRN

## 2015-04-16 MED ORDER — OXYTOCIN BOLUS FROM INFUSION
500.0000 mL | INTRAVENOUS | Status: DC
  Administered 2015-04-17: 500 mL via INTRAVENOUS

## 2015-04-16 MED ORDER — FENTANYL 2.5 MCG/ML BUPIVACAINE 1/10 % EPIDURAL INFUSION (WH - ANES)
14.0000 mL/h | INTRAMUSCULAR | Status: DC | PRN
  Administered 2015-04-17: 14 mL/h via EPIDURAL
  Filled 2015-04-16: qty 125

## 2015-04-16 MED ORDER — TERBUTALINE SULFATE 1 MG/ML IJ SOLN: 0.2500 mg | Freq: Once | INTRAMUSCULAR | Status: AC | PRN

## 2015-04-16 MED ORDER — ACETAMINOPHEN 325 MG PO TABS
650.0000 mg | ORAL_TABLET | ORAL | Status: DC | PRN
  Administered 2015-04-16: 650 mg via ORAL
  Filled 2015-04-16: qty 2

## 2015-04-16 NOTE — Progress Notes (Signed)
Labor Progress Note  Laurie Norris is a 24 y.o. [phone] at [redacted]w[redacted]d  admitted for induction of labor due to Post dates.  S: Doing well. Starting to feel more discomfort in her back.   O:  BP 94/81 mmHg  Pulse 86  Resp 15  Ht 5\' 4"  (1.626 m)  Wt 154 lb (69.854 kg)  BMI 26.42 kg/m2  LMP 08/23/2014 FHT:  FHR: 135 bpm, variability: moderate,  accelerations:  Present,  decelerations:  Present Variable UC:   regular, every 2-4 minutes SVE:   Dilation: 1.5 Effacement (%): 60 Station: Ballotable Exam by:: K.Shaw, CNM  Labs: Lab Results  Component Value Date   WBC 10.0 04/16/2015   HGB 8.2* 04/16/2015   HCT 25.3* 04/16/2015   MCV 81.6 04/16/2015   PLT 252 04/16/2015    Assessment / Plan: 23 y.o. [id number] [redacted]w[redacted]d her for IOL for PD.   Labor: Not in active labor. Has had cytotec x2 with some change. Will start Pitocin for further induction.  Fetal Wellbeing:  Category I Pain Control:  Labor support without medications Anticipated MOD:  NSVD  Expectant management   Caryl Ada, DO 04/16/2015, 7:55 PM PGY-1, [facility]

## 2015-04-16 NOTE — Progress Notes (Signed)
Attempted foley bulb placement--unsuccessful--orders for cytotec

## 2015-04-16 NOTE — Progress Notes (Signed)
Labor Progress Note  Laurie Norris is a 24 y.o. [phone] at [redacted]w[redacted]d  admitted for induction of labor due to Post dates.  S: Doing well. Has not needed any medications.   O:  BP 100/48 mmHg  Pulse 80  Resp 16  Ht 5\' 4"  (1.626 m)  Wt 154 lb (69.854 kg)  BMI 26.42 kg/m2  LMP 08/23/2014 FHT:  FHR: 140 bpm, variability: moderate,  accelerations:  Present,  decelerations:  Present Variable UC:   regular, every 2-4 minutes SVE:   Dilation: 1.5 Effacement (%): 60 Station: Ballotable Exam by:: K.Shaw, CNM  Labs: Lab Results  Component Value Date   WBC 10.0 04/16/2015   HGB 8.2* 04/16/2015   HCT 25.3* 04/16/2015   MCV 81.6 04/16/2015   PLT 252 04/16/2015    Assessment / Plan: 23 y.o. [id number] [redacted]w[redacted]d her for IOL for PD. UDS came back negative but 2wks prior positive cocaine metabolites in system. Continue IOL.  Labor: Progressing normally, cytotec x2 Fetal Wellbeing:  Category I Pain Control:  Labor support without medications Anticipated MOD:  NSVD  Expectant management   Caryl Ada, DO 04/16/2015, 4:51 PM PGY-1, [facility]

## 2015-04-16 NOTE — H&P (Signed)
LABOR ADMISSION HISTORY AND PHYSICAL  Laurie Norris is a 24 y.o. female [phone] with IUP at [redacted]w[redacted]d by 16Wk [place] presenting for IOL. She has missed several appointments and given high risk situation with missing appts, drug use(narcotics, cocaine, THC), multiparous pregnancy that is post dates IOL was the best option. She reports +FM, no contractions, No LOF, no VB.  She plans on bottle feeding. She request BTL for birth control.  Dating: By 16.6wk [place] --->  Estimated Date of Delivery: 04/09/15  Sono:   , CWD, normal anatomy, cephalic presentation, 2306g, 45% EFW  Prenatal History/Complications: -Drug dependence during pregnancy [facility]  FOB [place], [place], 1st baby, dating  Dating By 16.6 sono  Pap Normal 2015 @ [facility]  GC/CT Initial: -/- 36+wks:  Genetic Screen NT/IT: too late  CF screen neg  Anatomic US WNL, [redacted]w[redacted]d nml> f/u  nml  Flu vaccine Got at [facility]  Tdap Recommended ~ 28wks  Glucose Screen  1 hr- 90  GBS w/sensitivities GBS negative  Feed Preference   Contraception BTS - NEEDS TO RESIGN BTS CONSENT - NAME SHOULD READ Laurie Norris      Past Medical History: Past Medical History  Diagnosis Date  . Scoliosis   . Chronic back pain 10/26/2014  . History of narcotic use 10/26/2014  . Pregnant 10/26/2014    Past Surgical History: Past Surgical History  Procedure Laterality Date  . Knee surgery    . Leg surgery from trauma      Obstetrical History: OB History    Gravida Para Term Preterm AB TAB SAB Ectopic Multiple Living   Obstetric Comments   No GDM      Social History: History   Social History  . Marital Status: Single    Spouse Name: N/A  . Number of Children: N/A  . Years of Education: N/A   Occupational History  . student    Social History Main Topics  . Smoking status: Current Every Day Smoker -- 1.00 packs/day for 4 years    Types: Cigarettes  . Smokeless tobacco: Never Used  .  Alcohol Use: No  . Drug Use: No     Comment: last used percocet 6/17, last used marijuana with positive pregnancy test   . Sexual Activity: Yes    Birth Control/ Protection: None   Other Topics Concern  . None   Social History Narrative    Family History: Family History  Problem Relation Age of Onset  . Diabetes Brother   . COPD Maternal Grandmother   . Arthritis Maternal Grandfather     Allergies: Allergies  Allergen Reactions  . Penicillins Anaphylaxis and Swelling    Prescriptions prior to admission  Medication Sig Dispense Refill Last Dose  . acetaminophen (TYLENOL) 500 MG tablet Take 500 mg by mouth every 6 (six) hours as needed for mild pain or moderate pain.   Past Week at Unknown time  . calcium carbonate (TUMS - DOSED IN MG ELEMENTAL CALCIUM) 500 MG chewable tablet Chew 2 tablets by mouth 2 (two) times daily as needed for indigestion or heartburn.   Past Week at Unknown time  . flintstones complete (FLINTSTONES) 60 MG chewable tablet Chew 2 tablets by mouth daily. .   04/16/2015 at Unknown time  . oxyCODONE-acetaminophen (PERCOCET/ROXICET) 5-325 MG per tablet Take 1 tablet by mouth every 8 (eight) hours as needed for severe pain. 42  tablet 0 04/15/2015 at Unknown time    Review of Systems  Constitutional: Negative for fever.  Eyes: Negative for blurred vision.  Respiratory: Negative for shortness of breath.   Cardiovascular: Negative for chest pain.  Gastrointestinal: Negative for nausea, vomiting and abdominal pain.  Genitourinary: Negative for dysuria.  Neurological: Negative for dizziness and headaches.  Also  in HPI  BP 110/71 mmHg  Pulse 102  Resp 16  Ht  (1.626 m)  Wt 154 lb (69.854 kg)  BMI 26.42 kg/m2  LMP 08/23/2014 General appearance: alert, cooperative and no distress Lungs: clear to auscultation bilaterally Heart: regular rate and rhythm Abdomen: soft, non-tender; bowel sounds normal Pelvic: Adequate Extremities: Homans sign is negative,  no sign of DVT Presentation: cephalic Fetal monitoringBaseline: 130 bpm, Variability: Good {> 6 bpm), Accelerations: Reactive and Decelerations: Absent Uterine activity: irregular   Dilation: 1.5 Effacement (%): 50, 60 Station: Ballotable Exam by:: Dr.Phelps   Prenatal labs: ABO, Rh: --/--/A NEG (06/24 0855) Antibody: PENDING (06/24 0855) Rubella:   RPR: NON REAC (04/06 1707)  HBsAg: NEGATIVE (01/12 1421)  HIV: NONREACTIVE (04/06 1707)  GBS: Negative (06/07 0000)  1 hr Glucola 90 Genetic screening Too late Anatomy US normal  Prenatal Transfer Tool  Maternal Diabetes: No Genetic Screening: Declined Maternal Ultrasounds/Referrals: Normal Fetal Ultrasounds or other Referrals:  None Maternal Substance Abuse:  Yes:  Type: Smoker, Marijuana, Cocaine, Prescription drugs Significant Maternal Medications:  None Significant Maternal Lab Results: Lab values include: Group B Strep negative, Rh negative  Results for orders placed or performed during the hospital encounter of 04/16/15 (from the past 24 hour(s))  CBC   Collection Time: 04/16/15  8:55 AM  Result Value Ref Range   WBC 10.0 4.0 - 10.5 K/uL   RBC 3.10 (L) 3.87 - 5.11 MIL/uL   Hemoglobin 8.2 (L) 12.0 - 15.0 g/dL   HCT 16.1 (L) 09.6 - 04.5 %   MCV 81.6 78.0 - 100.0 fL   MCH 26.5 26.0 - 34.0 pg   MCHC 32.4 30.0 - 36.0 g/dL   RDW 40.9 81.1 - 91.4 %   Platelets 252 150 - 400 K/uL  Type and screen   Collection Time: 04/16/15  8:55 AM  Result Value Ref Range   ABO/RH(D) A NEG    Antibody Screen PENDING    Sample Expiration 04/19/2015     Patient Active Problem List   Diagnosis Date Noted  . Post-dates pregnancy 04/16/2015  . Drug dependence during pregnancy 01/20/2015  . H/O macrosomia in infant in prior pregnancy, currently pregnant 01/18/2015  . History of preterm delivery, currently pregnant 01/18/2015  . Rh negative state in antepartum period 11/04/2014  . Cocaine use complicating pregnancy 11/04/2014  .  Marijuana use 11/04/2014  . Supervision of other high-risk pregnancy 11/03/2014  . Smoker 11/03/2014  . Chronic back pain 10/26/2014  . History of narcotic use 10/26/2014  . 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 BUCKET HANDLE TEAR OF LATERAL MENISCUS 09/27/2009  . KNEE JOINT INSTABILITY 09/01/2009    Assessment: KUSHI KUN is a 24 y.o. [phone] at [redacted]w[redacted]d here for IOL 2/2 post dates. Patient has had limited prenatal care and drug use throughout pregnancy.   #Labor: IOL with cytotec. Patient cervix to posterior for FB at this time (unsuccessful placement), will recheck after cytotec and place if needed. Bishop score 3.  #Pain: Plan for epidural #FWB: Cat 1 #ID: GBS neg #MOF: Bottle #MOC: BTS #Circ: N/A, female  Caryl Ada, DO 04/16/2015, 10:17 AM PGY-1, [facility]   CNM attestation:  I have seen and examined this patient; I agree with above documentation in the resident's note.   Laurie Norris is a 24 y.o. [phone] here for IOL due to postterm preg complicated by high risk situation 2/2 narcotic addiction.  PE: BP 110/71 mmHg  Pulse 102  Resp 16  Ht 5\' 4"  (1.626 m)  Wt 69.854 kg (154 lb)  BMI 26.42 kg/m2  LMP 08/23/2014 Gen: calm comfortable, NAD Resp: normal effort, no distress Abd: gravid  ROS, labs, PMH reviewed  Plan: Admit to [employer] Cx ripening Anticipate SVD  SHAW, KIMBERLY CNM 04/16/2015, 10:55 AM

## 2015-04-17 ENCOUNTER — Encounter (HOSPITAL_COMMUNITY)
Admission: RE | Disposition: A | Discharge status: Home / Self Care | Payer: Self-pay | Source: Ambulatory Visit | Attending: Obstetrics and Gynecology

## 2015-04-17 ENCOUNTER — Inpatient Hospital Stay (HOSPITAL_COMMUNITY): Payer: Medicaid Other | Admitting: Anesthesiology

## 2015-04-17 ENCOUNTER — Encounter (HOSPITAL_COMMUNITY): Payer: Self-pay

## 2015-04-17 DIAGNOSIS — Z3A41 41 weeks gestation of pregnancy: Secondary | ICD-10-CM

## 2015-04-17 DIAGNOSIS — Z302 Encounter for sterilization: Secondary | ICD-10-CM

## 2015-04-17 DIAGNOSIS — O99324 Drug use complicating childbirth: Secondary | ICD-10-CM

## 2015-04-17 DIAGNOSIS — F1920 Other psychoactive substance dependence, uncomplicated: Secondary | ICD-10-CM

## 2015-04-17 DIAGNOSIS — F1410 Cocaine abuse, uncomplicated: Secondary | ICD-10-CM

## 2015-04-17 HISTORY — PX: TUBAL LIGATION: SHX77

## 2015-04-17 LAB — RPR: RPR Ser Ql: NONREACTIVE

## 2015-04-17 LAB — MRSA PCR SCREENING: MRSA BY PCR: NEGATIVE

## 2015-04-17 SURGERY — LIGATION, FALLOPIAN TUBE, POSTPARTUM
Anesthesia: Epidural | Site: Abdomen | Laterality: Bilateral

## 2015-04-17 MED ORDER — ZOLPIDEM TARTRATE 5 MG PO TABS: 5.0000 mg | ORAL_TABLET | Freq: Every evening | ORAL | Status: DC | PRN

## 2015-04-17 MED ORDER — DEXAMETHASONE SODIUM PHOSPHATE 4 MG/ML IJ SOLN
INTRAMUSCULAR | Status: AC
  Filled 2015-04-17: qty 1

## 2015-04-17 MED ORDER — ONDANSETRON HCL 4 MG/2ML IJ SOLN
INTRAMUSCULAR | Status: DC | PRN
  Administered 2015-04-17: 4 mg via INTRAVENOUS

## 2015-04-17 MED ORDER — LACTATED RINGERS IV SOLN
INTRAVENOUS | Status: DC | PRN
  Administered 2015-04-17 (×2): via INTRAVENOUS

## 2015-04-17 MED ORDER — FENTANYL CITRATE (PF) 100 MCG/2ML IJ SOLN
INTRAMUSCULAR | Status: AC
  Filled 2015-04-17: qty 2

## 2015-04-17 MED ORDER — IBUPROFEN 600 MG PO TABS
600.0000 mg | ORAL_TABLET | Freq: Four times a day (QID) | ORAL | Status: DC
  Administered 2015-04-17 – 2015-04-18 (×4): 600 mg via ORAL
  Filled 2015-04-17 (×5): qty 1

## 2015-04-17 MED ORDER — ONDANSETRON HCL 4 MG PO TABS: 4.0000 mg | ORAL_TABLET | ORAL | Status: DC | PRN

## 2015-04-17 MED ORDER — PROMETHAZINE HCL 25 MG/ML IJ SOLN: 6.2500 mg | INTRAMUSCULAR | Status: DC | PRN

## 2015-04-17 MED ORDER — WITCH HAZEL-GLYCERIN EX PADS: 1.0000 "application " | MEDICATED_PAD | CUTANEOUS | Status: DC | PRN

## 2015-04-17 MED ORDER — DIPHENHYDRAMINE HCL 25 MG PO CAPS: 25.0000 mg | ORAL_CAPSULE | Freq: Four times a day (QID) | ORAL | Status: DC | PRN

## 2015-04-17 MED ORDER — MEPERIDINE HCL 25 MG/ML IJ SOLN: 6.2500 mg | INTRAMUSCULAR | Status: DC | PRN

## 2015-04-17 MED ORDER — SIMETHICONE 80 MG PO CHEW: 80.0000 mg | CHEWABLE_TABLET | ORAL | Status: DC | PRN

## 2015-04-17 MED ORDER — LANOLIN HYDROUS EX OINT: TOPICAL_OINTMENT | CUTANEOUS | Status: DC | PRN

## 2015-04-17 MED ORDER — KETOROLAC TROMETHAMINE 60 MG/2ML IM SOLN
INTRAMUSCULAR | Status: DC | PRN
  Administered 2015-04-17: 30 mg via INTRAMUSCULAR

## 2015-04-17 MED ORDER — FAMOTIDINE 20 MG PO TABS
40.0000 mg | ORAL_TABLET | Freq: Once | ORAL | Status: AC
  Administered 2015-04-17: 40 mg via ORAL
  Filled 2015-04-17: qty 2

## 2015-04-17 MED ORDER — PRENATAL MULTIVITAMIN CH
1.0000 | ORAL_TABLET | Freq: Every day | ORAL | Status: DC
  Administered 2015-04-18: 1 via ORAL
  Filled 2015-04-17: qty 1

## 2015-04-17 MED ORDER — METOCLOPRAMIDE HCL 10 MG PO TABS
10.0000 mg | ORAL_TABLET | Freq: Once | ORAL | Status: AC
  Administered 2015-04-17: 10 mg via ORAL
  Filled 2015-04-17: qty 1

## 2015-04-17 MED ORDER — FENTANYL CITRATE (PF) 100 MCG/2ML IJ SOLN: 25.0000 ug | INTRAMUSCULAR | Status: DC | PRN

## 2015-04-17 MED ORDER — MIDAZOLAM HCL 2 MG/2ML IJ SOLN
INTRAMUSCULAR | Status: AC
  Filled 2015-04-17: qty 2

## 2015-04-17 MED ORDER — SODIUM BICARBONATE 8.4 % IV SOLN
INTRAVENOUS | Status: DC | PRN
  Administered 2015-04-17 (×4): 5 mL via EPIDURAL

## 2015-04-17 MED ORDER — TRAMADOL HCL 50 MG PO TABS
100.0000 mg | ORAL_TABLET | Freq: Four times a day (QID) | ORAL | Status: DC | PRN
Start: 2015-04-17 — End: 2015-04-18

## 2015-04-17 MED ORDER — SODIUM BICARBONATE 8.4 % IV SOLN
INTRAVENOUS | Status: AC
  Filled 2015-04-17: qty 50

## 2015-04-17 MED ORDER — LACTATED RINGERS IV SOLN
INTRAVENOUS | Status: DC
  Administered 2015-04-17: 20 mL via INTRAVENOUS

## 2015-04-17 MED ORDER — SENNOSIDES-DOCUSATE SODIUM 8.6-50 MG PO TABS
2.0000 | ORAL_TABLET | ORAL | Status: DC
  Administered 2015-04-18: 2 via ORAL
  Filled 2015-04-17 (×2): qty 2

## 2015-04-17 MED ORDER — BUPIVACAINE HCL (PF) 0.25 % IJ SOLN
INTRAMUSCULAR | Status: DC | PRN
Start: 2015-04-17 — End: 2015-04-17
  Administered 2015-04-17: 10 mL

## 2015-04-17 MED ORDER — TETANUS-DIPHTH-ACELL PERTUSSIS 5-2.5-18.5 LF-MCG/0.5 IM SUSP: 0.5000 mL | Freq: Once | INTRAMUSCULAR | Status: DC

## 2015-04-17 MED ORDER — ONDANSETRON HCL 4 MG/2ML IJ SOLN: 4.0000 mg | INTRAMUSCULAR | Status: DC | PRN

## 2015-04-17 MED ORDER — BUPIVACAINE HCL (PF) 0.25 % IJ SOLN
INTRAMUSCULAR | Status: AC
  Filled 2015-04-17: qty 30

## 2015-04-17 MED ORDER — ACETAMINOPHEN 325 MG PO TABS: 650.0000 mg | ORAL_TABLET | ORAL | Status: DC | PRN

## 2015-04-17 MED ORDER — DEXAMETHASONE SODIUM PHOSPHATE 10 MG/ML IJ SOLN
INTRAMUSCULAR | Status: DC | PRN
  Administered 2015-04-17: 4 mg via INTRAVENOUS

## 2015-04-17 MED ORDER — MISOPROSTOL 200 MCG PO TABS
ORAL_TABLET | ORAL | Status: AC
  Administered 2015-04-17: 800 ug via ORAL
  Filled 2015-04-17: qty 4

## 2015-04-17 MED ORDER — LIDOCAINE HCL (PF) 1 % IJ SOLN
INTRAMUSCULAR | Status: DC | PRN
  Administered 2015-04-17: 5 mL
  Administered 2015-04-17: 3 mL
  Administered 2015-04-17: 5 mL

## 2015-04-17 MED ORDER — ONDANSETRON HCL 4 MG/2ML IJ SOLN
INTRAMUSCULAR | Status: AC
  Filled 2015-04-17: qty 2

## 2015-04-17 MED ORDER — LIDOCAINE-EPINEPHRINE (PF) 2 %-1:200000 IJ SOLN
INTRAMUSCULAR | Status: AC
  Filled 2015-04-17: qty 20

## 2015-04-17 MED ORDER — BENZOCAINE-MENTHOL 20-0.5 % EX AERO
1.0000 "application " | INHALATION_SPRAY | CUTANEOUS | Status: DC | PRN
  Filled 2015-04-17: qty 56

## 2015-04-17 MED ORDER — MISOPROSTOL 200 MCG PO TABS
800.0000 ug | ORAL_TABLET | Freq: Once | ORAL | Status: AC
  Administered 2015-04-17: 800 ug via ORAL

## 2015-04-17 MED ORDER — DIBUCAINE 1 % RE OINT
1.0000 "application " | TOPICAL_OINTMENT | RECTAL | Status: DC | PRN
  Filled 2015-04-17: qty 28

## 2015-04-17 MED ORDER — OXYCODONE-ACETAMINOPHEN 5-325 MG PO TABS
2.0000 | ORAL_TABLET | Freq: Four times a day (QID) | ORAL | Status: DC | PRN
  Administered 2015-04-17 (×2): 1 via ORAL
  Administered 2015-04-18: 2 via ORAL
  Filled 2015-04-17 (×3): qty 2

## 2015-04-17 MED ORDER — FENTANYL CITRATE (PF) 100 MCG/2ML IJ SOLN
INTRAMUSCULAR | Status: DC | PRN
  Administered 2015-04-17 (×2): 100 ug via INTRAVENOUS

## 2015-04-17 MED ORDER — MIDAZOLAM HCL 5 MG/5ML IJ SOLN
INTRAMUSCULAR | Status: DC | PRN
  Administered 2015-04-17: 2 mg via INTRAVENOUS

## 2015-04-17 SURGICAL SUPPLY — 21 items
BLADE SURG 11 STRL SS (BLADE) ×3 IMPLANT
CHLORAPREP W/TINT 26ML (MISCELLANEOUS) ×3 IMPLANT
CLIP FILSHIE TUBAL LIGA STRL (Clip) ×3 IMPLANT
CLOTH BEACON ORANGE TIMEOUT ST (SAFETY) ×3 IMPLANT
DRSG OPSITE POSTOP 3X4 (GAUZE/BANDAGES/DRESSINGS) ×3 IMPLANT
GLOVE BIO SURGEON STRL SZ 6.5 (GLOVE) ×2 IMPLANT
GLOVE BIO SURGEONS STRL SZ 6.5 (GLOVE) ×1
GLOVE BIOGEL PI IND STRL 7.0 (GLOVE) ×1 IMPLANT
GLOVE BIOGEL PI INDICATOR 7.0 (GLOVE) ×2
GOWN STRL REUS W/TWL LRG LVL3 (GOWN DISPOSABLE) ×6 IMPLANT
NEEDLE HYPO 22GX1.5 SAFETY (NEEDLE) ×3 IMPLANT
NS IRRIG 1000ML POUR BTL (IV SOLUTION) ×3 IMPLANT
PACK ABDOMINAL MINOR (CUSTOM PROCEDURE TRAY) ×3 IMPLANT
SPONGE LAP 4X18 X RAY DECT (DISPOSABLE) IMPLANT
SUT VIC AB 0 CT1 27 (SUTURE) ×2
SUT VIC AB 0 CT1 27XBRD ANBCTR (SUTURE) ×1 IMPLANT
SUT VICRYL 4-0 PS2 18IN ABS (SUTURE) ×3 IMPLANT
SYR CONTROL 10ML LL (SYRINGE) ×3 IMPLANT
TOWEL OR 17X24 6PK STRL BLUE (TOWEL DISPOSABLE) ×6 IMPLANT
TRAY FOLEY BAG SILVER LF 16FR (SET/KITS/TRAYS/PACK) ×3 IMPLANT
WATER STERILE IRR 1000ML POUR (IV SOLUTION) ×3 IMPLANT

## 2015-04-17 NOTE — Anesthesia Procedure Notes (Signed)
Epidural Patient location during procedure: OB  Staffing Anesthesiologist: Honestii Marton Performed by: anesthesiologist   Preanesthetic Checklist Completed: patient identified, site marked, surgical consent, pre-op evaluation, timeout performed, IV checked, risks and benefits discussed and monitors and equipment checked  Epidural Patient position: sitting Prep: DuraPrep Patient monitoring: heart rate, continuous pulse ox and blood pressure Approach: midline Location: L3-L4 Injection technique: LOR saline  Needle:  Needle type: Tuohy  Needle gauge: 17 G Needle length: 9 cm and 9 Needle insertion depth: 5 cm Catheter type: closed end flexible Catheter size: 20 Guage Catheter at skin depth: 10 cm Test dose: negative  Assessment Events: blood not aspirated, injection not painful, no injection resistance, negative IV test and no paresthesia  Additional Notes Patient identified. Risks/Benefits/Options discussed with patient including but not limited to bleeding, infection, nerve damage, paralysis, failed block, incomplete pain control, headache, blood pressure changes, nausea, vomiting, reactions to medication both or allergic, itching and postpartum back pain. Confirmed with bedside nurse the patient's most recent platelet count. Confirmed with patient that they are not currently taking any anticoagulation, have any bleeding history or any family history of bleeding disorders. Patient expressed understanding and wished to proceed. All questions were answered. Sterile technique was used throughout the entire procedure. Please see nursing notes for vital signs. Test dose was given through epidural needle and negative prior to continuing to dose epidural or start infusion. Warning signs of high block given to the patient including shortness of breath, tingling/numbness in hands, complete motor block, or any concerning symptoms with instructions to call for help. Patient was given  instructions on fall risk and not to get out of bed. All questions and concerns addressed with instructions to call with any issues.   

## 2015-04-17 NOTE — Progress Notes (Signed)
Patient ID: Laurie Norris, female   DOB: [birth year], 24 y.o.   MRN: [id number] Patient is s/p SVD, requests permanent sterilization. The procedure and the risk of anesthesia, bleeding, infection, future regret, bowel and bladder injury, failure (1/200) and ectopic pregnancy were discussed and her questions were answered. The procedure is scheduled today.   Adam Phenix, MD 04/17/2015

## 2015-04-17 NOTE — Op Note (Signed)
Laurie Norris 04/16/2015 - 04/17/2015  PREOPERATIVE DIAGNOSIS:  Multiparity, undesired fertility  POSTOPERATIVE DIAGNOSIS:  Multiparity, undesired fertility  PROCEDURE:  Postpartum Bilateral Tubal Sterilization using Filshie Clips   ANESTHESIA:  Epidural, general  COMPLICATIONS:  None immediate.  ESTIMATED BLOOD LOSS:  Less than 20 ml.  FLUIDS: 1000 ml LR.  URINE OUTPUT:  50 ml of clear urine.  INDICATIONS: 24 y.o. [id number]  with undesired fertility,status post vaginal delivery, desires permanent sterilization. Risks and benefits of procedure discussed with patient including permanence of method, bleeding, infection, injury to surrounding organs and need for additional procedures. Risk failure of 0.5-1% with increased risk of ectopic gestation if pregnancy occurs was also discussed with patient.   FINDINGS:  Normal uterus, tubes, and ovaries.  TECHNIQUE:  The patient was taken to the operating room where her epidural anesthesia was dosed but a surgical level was not reached. General anesthesia was induced. She was then placed in the dorsal supine position and prepped and draped in sterile fashion.  After an adequate timeout was performed, attention was turned to the patient's abdomen where a small transverse skin incision was made under the umbilical fold. 0.25 % Marcaine was injected.The incision was taken down to the layer of fascia using the scalpel, and fascia was incised, and extended bilaterally using Mayo scissors. The peritoneum was entered in a sharp fashion. Attention was then turned to the patient's uterus, and left fallopian tube was identified and followed out to the fimbriated end.  A Filshie clip was placed on the left fallopian tube about 2 cm from the cornual attachment, with care given to incorporate the underlying mesosalpinx.  A similar process was carried out on the right side allowing for bilateral tubal sterilization.  Good hemostasis was noted overall.  Local analgesia  was drizzled on both operative sites.The instruments were then removed from the patient's abdomen and the fascial incision was repaired with 0 Vicryl, and the skin was closed with a 4-0 Vicryl subcuticular stitch. The patient tolerated the procedure well.  Sponge, lap, and needle counts were correct times two.  The patient was then taken to the recovery room awake, extubated and in stable condition.  Adam Phenix, MD 04/17/2015 12:24 PM

## 2015-04-17 NOTE — Anesthesia Procedure Notes (Signed)
Procedure Name: Intubation Date/Time: 04/17/2015 11:55 AM Performed by: Shanon Payor Pre-anesthesia Checklist: Patient identified, Emergency Drugs available, Suction available, Patient being monitored and Timeout performed Patient Re-evaluated:Patient Re-evaluated prior to inductionOxygen Delivery Method: Circle system utilized Preoxygenation: Pre-oxygenation with 100% oxygen Intubation Type: IV induction and Rapid sequence Laryngoscope Size: Mac and 3 Tube size: 7.0 mm Number of attempts: 1 Placement Confirmation: ETT inserted through vocal cords under direct vision,  positive ETCO2 and breath sounds checked- equal and bilateral Secured at: 20 cm Tube secured with: Tape Dental Injury: Teeth and Oropharynx as per pre-operative assessment

## 2015-04-17 NOTE — Anesthesia Postprocedure Evaluation (Signed)
  Anesthesia Post-op Note  Patient: Laurie Norris  Procedure(s) Performed: Procedure(s): POST PARTUM TUBAL LIGATION (Bilateral)  Patient Location: PACU  Anesthesia Type:General  Level of Consciousness: awake  Airway and Oxygen Therapy: Patient Spontanous Breathing  Post-op Pain: none  Post-op Assessment: Post-op Vital signs reviewed              Post-op Vital Signs: Reviewed and stable  Last Vitals:  Filed Vitals:   04/17/15 0900  BP: 122/77  Pulse: 60  Temp: 36.9 C  Resp: 18    Complications: No apparent anesthesia complications

## 2015-04-17 NOTE — Anesthesia Preprocedure Evaluation (Signed)
Anesthesia Evaluation  Patient identified by MRN, date of birth, ID band Patient awake    Reviewed: Allergy & Precautions, H&P , NPO status , Patient's Chart, lab work & pertinent test results  History of Anesthesia Complications Negative for: history of anesthetic complications  Airway Mallampati: II  TM Distance: >3 FB Neck ROM: full    Dental no notable dental hx. (+) Teeth Intact   Pulmonary Current Smoker,  breath sounds clear to auscultation  Pulmonary exam normal       Cardiovascular negative cardio ROS Normal cardiovascular examRhythm:regular Rate:Normal     Neuro/Psych Drug use during pregnancy, smoking, cocaine, ect.negative neurological ROS  negative psych ROS   GI/Hepatic negative GI ROS, Neg liver ROS,   Endo/Other  negative endocrine ROS  Renal/GU      Musculoskeletal   Abdominal   Peds  Hematology negative hematology ROS (+) anemia , 8/25 before delivery   Anesthesia Other Findings   Reproductive/Obstetrics (+) Pregnancy                             Anesthesia Physical Anesthesia Plan  ASA: II  Anesthesia Plan: Epidural and General ETT   Post-op Pain Management:    Induction:   Airway Management Planned:   Additional Equipment:   Intra-op Plan:   Post-operative Plan:   Informed Consent: I have reviewed the patients History and Physical, chart, labs and discussed the procedure including the risks, benefits and alternatives for the proposed anesthesia with the patient or authorized representative who has indicated his/her understanding and acceptance.     Plan Discussed with:   Anesthesia Plan Comments:         Anesthesia Quick Evaluation

## 2015-04-17 NOTE — Anesthesia Preprocedure Evaluation (Signed)
Anesthesia Evaluation  Patient identified by MRN, date of birth, ID band Patient awake    Reviewed: Allergy & Precautions, H&P , NPO status , Patient's Chart, lab work & pertinent test results  History of Anesthesia Complications Negative for: history of anesthetic complications  Airway Mallampati: II  TM Distance: >3 FB Neck ROM: full    Dental no notable dental hx. (+) Teeth Intact   Pulmonary Current Smoker,  breath sounds clear to auscultation  Pulmonary exam normal       Cardiovascular negative cardio ROS Normal cardiovascular examRhythm:regular Rate:Normal     Neuro/Psych negative neurological ROS  negative psych ROS   GI/Hepatic negative GI ROS, Neg liver ROS,   Endo/Other  negative endocrine ROS  Renal/GU negative Renal ROS  negative genitourinary   Musculoskeletal   Abdominal   Peds  Hematology negative hematology ROS (+)   Anesthesia Other Findings   Reproductive/Obstetrics (+) Pregnancy                             Anesthesia Physical Anesthesia Plan  ASA: II  Anesthesia Plan: Epidural   Post-op Pain Management:    Induction:   Airway Management Planned:   Additional Equipment:   Intra-op Plan:   Post-operative Plan:   Informed Consent: I have reviewed the patients History and Physical, chart, labs and discussed the procedure including the risks, benefits and alternatives for the proposed anesthesia with the patient or authorized representative who has indicated his/her understanding and acceptance.     Plan Discussed with:   Anesthesia Plan Comments:         Anesthesia Quick Evaluation

## 2015-04-17 NOTE — Transfer of Care (Signed)
Immediate Anesthesia Transfer of Care Note  Patient: Laurie Norris  Procedure(s) Performed: Procedure(s): POST PARTUM TUBAL LIGATION (Bilateral)  Patient Location: PACU  Anesthesia Type:General  Level of Consciousness: awake, alert  and oriented  Airway & Oxygen Therapy: Patient Spontanous Breathing and Patient connected to nasal cannula oxygen  Post-op Assessment: Report given to RN and Post -op Vital signs reviewed and stable  Post vital signs: Reviewed and stable  Last Vitals:  Filed Vitals:   04/17/15 0900  BP: 122/77  Pulse: 60  Temp: 36.9 C  Resp: 18    Complications: No apparent anesthesia complications

## 2015-04-17 NOTE — Progress Notes (Signed)
Spoke with mother's RN and informed that she recently returned from surgery.  Will follow up with mother tomorrow.  

## 2015-04-17 NOTE — Anesthesia Postprocedure Evaluation (Signed)
Anesthesia Post Note  Patient: Laurie Norris  Procedure(s) Performed: * No procedures listed *  Anesthesia type: Epidural  Patient location: Mother/Baby  Post pain: Pain level controlled  Post assessment: Post-op Vital signs reviewed  Last Vitals:  Filed Vitals:   04/17/15 0900  BP: 122/77  Pulse: 60  Temp: 36.9 C  Resp: 18    Post vital signs: Reviewed  Level of consciousness: awake  Complications: No apparent anesthesia complications

## 2015-04-18 MED ORDER — IBUPROFEN 600 MG PO TABS: 600.0000 mg | ORAL_TABLET | Freq: Four times a day (QID) | ORAL | Status: DC

## 2015-04-18 MED ORDER — OXYCODONE-ACETAMINOPHEN 5-325 MG PO TABS: 2.0000 | ORAL_TABLET | Freq: Four times a day (QID) | ORAL | Status: DC | PRN

## 2015-04-18 NOTE — Addendum Note (Signed)
Addendum  created 04/18/15 0837 by Shanon Payor, CRNA   Modules edited: Charges VN, Notes Section   Notes Section:  File: [id number]

## 2015-04-18 NOTE — Discharge Instructions (Signed)
Vaginal Delivery, Care After °Refer to this sheet in the next few weeks. These discharge instructions provide you with information on caring for yourself after delivery. Your caregiver may also give you specific instructions. Your treatment has been planned according to the most current medical practices available, but problems sometimes occur. Call your caregiver if you have any problems or questions after you go home. °HOME CARE INSTRUCTIONS °· Take over-the-counter or prescription medicines only as directed by your caregiver or pharmacist. °· Do not drink alcohol, especially if you are breastfeeding or taking medicine to relieve pain. °· Do not chew or smoke tobacco. °· Do not use illegal drugs. °· Continue to use good perineal care. Good perineal care includes: °¨ Wiping your perineum from front to back. °¨ Keeping your perineum clean. °· Do not use tampons or douche until your caregiver says it is okay. °· Shower, wash your hair, and take tub baths as directed by your caregiver. °· Wear a well-fitting bra that provides breast support. °· Eat healthy foods. °· Drink enough fluids to keep your urine clear or pale yellow. °· Eat high-fiber foods such as whole grain cereals and breads, brown rice, beans, and fresh fruits and vegetables every day. These foods may help prevent or relieve constipation. °· Follow your caregiver's recommendations regarding resumption of activities such as climbing stairs, driving, lifting, exercising, or traveling. °· Talk to your caregiver about resuming sexual activities. Resumption of sexual activities is dependent upon your risk of infection, your rate of healing, and your comfort and desire to resume sexual activity. °· Try to have someone help you with your household activities and your newborn for at least a few days after you leave the hospital. °· Rest as much as possible. Try to rest or take a nap when your newborn is sleeping. °· Increase your activities gradually. °· Keep  all of your scheduled postpartum appointments. It is very important to keep your scheduled follow-up appointments. At these appointments, your caregiver will be checking to make sure that you are healing physically and emotionally. °SEEK MEDICAL CARE IF:  °· You are passing large clots from your vagina. Save any clots to show your caregiver. °· You have a foul smelling discharge from your vagina. °· You have trouble urinating. °· You are urinating frequently. °· You have pain when you urinate. °· You have a change in your bowel movements. °· You have increasing redness, pain, or swelling near your vaginal incision (episiotomy) or vaginal tear. °· You have pus draining from your episiotomy or vaginal tear. °· Your episiotomy or vaginal tear is separating. °· You have painful, hard, or reddened breasts. °· You have a severe headache. °· You have blurred vision or see spots. °· You feel sad or depressed. °· You have thoughts of hurting yourself or your newborn. °· You have questions about your care, the care of your newborn, or medicines. °· You are dizzy or light-headed. °· You have a rash. °· You have nausea or vomiting. °· You were breastfeeding and have not had a menstrual period within 12 weeks after you stopped breastfeeding. °· You are not breastfeeding and have not had a menstrual period by the 12th week after delivery. °· You have a fever. °SEEK IMMEDIATE MEDICAL CARE IF:  °· You have persistent pain. °· You have chest pain. °· You have shortness of breath. °· You faint. °· You have leg pain. °· You have stomach pain. °· Your vaginal bleeding saturates two or more sanitary pads   in 1 hour. °MAKE SURE YOU:  °· Understand these instructions. °· Will watch your condition. °· Will get help right away if you are not doing well or get worse. °Document Released: 10/06/2000 Document Revised: 02/23/2014 Document Reviewed: 06/05/2012 °ExitCare® Patient Information ©2015 [employer]. This information is not intended to  replace advice given to you by your health care provider. Make sure you discuss any questions you have with your health care provider. ° °Postpartum Tubal Ligation °Care After °Refer to this sheet in the next few weeks. These instructions provide you with information on caring for yourself after your procedure. Your caregiver may also give you more specific instructions. Your treatment has been planned according to current medical practices, but problems sometimes occur. Call your caregiver if you have any problems or questions after your procedure. °HOME CARE INSTRUCTIONS  °· Rest the remainder of the day. °· Only take over-the-counter or prescription medicines for pain, discomfort, or fever as directed by your caregiver. Do not take aspirin. It can cause bleeding. °· Gradually resume daily activities, diet, rest, driving, and work. °· Avoid sexual intercourse for 2 weeks or as directed. °· Do not drive while taking pain medicine. °· Do not lift anything over 5 pounds for 2 weeks or as directed. °· Only take showers, not baths, until you are seen by your caregiver. °· Change bandages (dressings) as directed. °· Take your temperature twice a day and record it. °· Try to have help for the first 7-10 days for your household needs. °· Return to your caregiver to get your stitches (sutures) removed and for follow-up visits as directed. °SEEK MEDICAL CARE IF:  °· You have redness, swelling, or increasing pain in the wound. °· You have drainage from the wound lasting longer than 1 day. °· Your pain is getting worse. °· You have a rash. °· You become dizzy or lightheaded. °· You have a reaction to your medicine. °· You need stronger medicine or a change in your pain medicine. °· You notice a bad smell coming from the wound or dressing. °· Your wound breaks open after the sutures have been removed. °· You are constipated. °SEEK IMMEDIATE MEDICAL CARE IF:  °· You faint. °· You have a fever. °· You have increasing abdominal  pain. °· You have severe pain in your shoulders. °· You have bleeding or drainage from the suture sites or vagina following surgery. °· You have shortness of breath or difficulty breathing. °· You have chest or leg pain. °· You have persistent nausea, vomiting, or diarrhea. °MAKE SURE YOU:  °· Understand these instructions. °· Watch your condition. °· Get help right away if you are not doing well or get worse. °Document Released: 04/09/2012 Document Reviewed: 04/09/2012 °ExitCare® Patient Information ©2015 [employer]. This information is not intended to replace advice given to you by your health care provider. Make sure you discuss any questions you have with your health care provider. ° ° °

## 2015-04-18 NOTE — Discharge Summary (Signed)
Obstetric Discharge Summary Reason for Admission: induction of labor Prenatal Procedures: NST and ultrasound Intrapartum Procedures: spontaneous vaginal delivery Postpartum Procedures: none Complications-Operative and Postpartum: none  Delivery Note Pt pushed well and at 4:14 AM a viable female was delivered via Vaginal, Spontaneous Delivery (Presentation: Left Occiput Anterior). APGAR: 6, 9; weight 8 lb 6.6 oz (3815 g). Infant dried and lifted to pt's abd. Cord clamped and cut by pt. Hospital cord blood sample collected.  Placenta status: Intact, Manual removal. Cord: 3 vessels  Anesthesia: Epidural  Episiotomy: None Lacerations: None Est. Blood Loss (mL): 328   Mom to postpartum. Baby to Couplet care / Skin to Skin.   Cam Hai CNM 04/17/2015, 4:34 AM   Hospital Course:  Active Problems:   Post-dates pregnancy   Laurie Norris is a 24 y.o. [phone] s/p SVD.  Patient was admitted for IOL 2/2 opiate addiction, +UDS for [facility] and cocaine, frequent no shows.  She has postpartum course that was uncomplicated including no problems with ambulating, PO intake, urination, pain, or bleeding. The pt feels ready to go home and  will be discharged with outpatient follow-up.   Today: No acute events overnight.  Pt denies problems with ambulating, voiding or po intake.  She denies nausea or vomiting.  Pain is well controlled.  She has had flatus. Plan for birth control is  s/p BTL.  Method of Feeding: bottle  Physical Exam:  General: alert and cooperative Lochia: appropriate Uterine Fundus: firm Incision: no significant drainage DVT Evaluation: Negative Homan's sign.  H/H: Lab Results  Component Value Date/Time   HGB 8.2* 04/16/2015 08:55 AM   HCT 25.3* 04/16/2015 08:55 AM    Discharge Diagnoses: Term Pregnancy-delivered and postpartum bilateral tubal ligation  Discharge Information: Date: 04/18/2015 Activity: pelvic rest Diet: routine  Medications: Ibuprofen and  Percocet #10 Breast feeding:  No: declines Condition: stable   Instructions: refer to handout Discharge to: home   Discharge Instructions    Call MD for:  redness, tenderness, or signs of infection (pain, swelling, redness, odor or green/yellow discharge around incision site)    Complete by:  As directed      Call MD for:  severe uncontrolled pain    Complete by:  As directed      Call MD for:  temperature >100.4    Complete by:  As directed      Diet - low sodium heart healthy    Complete by:  As directed             Medication List    TAKE these medications        acetaminophen 500 MG tablet  Commonly known as:  TYLENOL  Take 500 mg by mouth every 6 (six) hours as needed for mild pain or moderate pain.     calcium carbonate 500 MG chewable tablet  Commonly known as:  TUMS - dosed in mg elemental calcium  Chew 2 tablets by mouth 2 (two) times daily as needed for indigestion or heartburn.     flintstones complete 60 MG chewable tablet  Chew 2 tablets by mouth daily. Marland Kitchen     ibuprofen 600 MG tablet  Commonly known as:  ADVIL,MOTRIN  Take 1 tablet (600 mg total) by mouth every 6 (six) hours.     oxyCODONE-acetaminophen 5-325 MG per tablet  Commonly known as:  PERCOCET/ROXICET  Take 2 tablets by mouth every 6 (six) hours as needed for moderate pain.  Follow-up Information    Follow up with [facility] In 5 weeks.   Specialty:  Obstetrics and Gynecology   Contact information:   [address] [place] [address] [phone]      Perry Mount ,MD OB Fellow 04/18/2015,5:29 AM

## 2015-04-18 NOTE — Progress Notes (Signed)
CLINICAL SOCIAL WORK MATERNAL/CHILD NOTE  Patient Details  Name: Laurie Norris MRN: [id number] Date of Birth: 04/17/2013  Date: 04/18/2015  Clinical Social Worker Initiating Note: Norlene Duel, LCSWDate/ Time Initiated: 04/18/15/0930   Child's Name: Laurie Norris   Legal Guardian:  (Parents Laurie Norris and Laurie Norris)   Need for Interpreter: None   Date of Referral: 04/17/15   Reason for Referral: Other (Comment)   Referral Source: [facility]   Address: [place], [place] [address]  Phone number:  [phone])   Household Members: Significant Other   Natural Supports (not living in the home): Immediate Family (Maternal grandmother Laurie Norris [phone])   Professional Supports:None   Employment: (FOB is employed)   Type of Work:     Education:     Museum/gallery curator Resources:Medicaid   Other Resources: [employer], Physicist, medical    Cultural/Religious Considerations Which May Impact Care: none noted  Strengths: Ability to meet basic needs , Home prepared for child    Risk Factors/Current Problems:  (Hx of substance dependence/abuse)   Cognitive State: Alert , Able to Concentrate    Mood/Affect: Bright , Happy    CSW Assessment: Acknowledged order for social work consult to assess mother's hx of substance abuse. Met with mother who was pleasant and receptive to CSW. FOB remained for part of the interview. She has three other children Laurie Norris age 22 and Laurie Norris and Laurie Norris age 22 and 1. Informed that she only has custody of the 22 year old. Mother states that she was abusing oxycodone and was unable to adequately care for her children because of her drug use. She further notes that DSS got involved and the 22 and 22 year old were eventually adopted by paternal aunt. She reportedly maintains custody of the 22 year old and has no open case with DSS. Informed that she still maintains contact  with the 22 and 6 year old. Mother states that FOB was instrumental in helping her to wean down to 1-2 oxycodone per day which is just enough to manage her pain. Informed that she is now being prescribed Percocet and has been taking them only when needed for pain control. She reports chronic back pain due to scoliosis. She admits to hx of snorting cocaine and smoking marijuana. She denies being addicted to cocaine and states that the last use was Jan. 2016. She also states that marijuana helps with the back pain and was also helping the nausea. She also reports that she last used marijuana Jan. 2016. She denies any other illicit drug use. UDS on mother and newborn were negative. Informed that FOB is not abusing any drugs. She is on probation for theft. Mother states that she feels capable of caring for newborn because she is no longer abusing prescription medication. She denies any treatment history. She denies any hx of mental illness. Mother informed of referral that will be made to DSS due to her hx of previous involvement. She seem to understand why referral was being made. Informed her of social work Fish farm manager.  CSW Plan/Description:    Mother informed of the hospital's drug screening policy Case reported to Bayard Beaver with [place]. Informed that a CPI will follow up with the case tomorrow. Will continue to monitor drug screen.  Rajesh Wyss J, LCSW 04/18/2015, 4:09 PM

## 2015-04-18 NOTE — Anesthesia Postprocedure Evaluation (Signed)
  Anesthesia Post-op Note  Patient: Laurie Norris  Procedure(s) Performed: Procedure(s): POST PARTUM TUBAL LIGATION (Bilateral)  Patient Location: PACU and Mother/Baby  Anesthesia Type:Epidural  Level of Consciousness: awake, alert  and oriented  Airway and Oxygen Therapy: Patient Spontanous Breathing  Post-op Pain: none  Post-op Assessment: Post-op Vital signs reviewed, Patient's Cardiovascular Status Stable, No headache, No backache, No residual numbness and No residual motor weakness  Post-op Vital Signs: Reviewed and stable  Complications: No apparent anesthesia complications

## 2015-04-19 ENCOUNTER — Encounter (HOSPITAL_COMMUNITY): Payer: Self-pay | Admitting: Obstetrics & Gynecology

## 2015-04-20 ENCOUNTER — Encounter (HOSPITAL_COMMUNITY): Payer: Self-pay | Admitting: Obstetrics & Gynecology

## 2015-04-20 ENCOUNTER — Encounter: Payer: Self-pay | Admitting: General Practice

## 2015-04-21 ENCOUNTER — Encounter (HOSPITAL_COMMUNITY): Payer: Self-pay | Admitting: Obstetrics & Gynecology

## 2015-05-26 ENCOUNTER — Ambulatory Visit: Payer: Medicaid Other | Admitting: Obstetrics & Gynecology

## 2015-11-02 NOTE — Progress Notes (Signed)
Post discharge chart review completed.  

## 2016-11-29 ENCOUNTER — Encounter (HOSPITAL_COMMUNITY): Payer: Self-pay | Admitting: *Deleted

## 2016-11-29 ENCOUNTER — Emergency Department (HOSPITAL_COMMUNITY)
Admission: EM | Admit: 2016-11-29 | Discharge: 2016-11-29 | Disposition: A | Discharge status: Home / Self Care | Payer: Medicaid Other | Attending: Emergency Medicine | Admitting: Emergency Medicine

## 2016-11-29 DIAGNOSIS — Z5321 Procedure and treatment not carried out due to patient leaving prior to being seen by health care provider: Secondary | ICD-10-CM | POA: Diagnosis not present

## 2016-11-29 DIAGNOSIS — N938 Other specified abnormal uterine and vaginal bleeding: Secondary | ICD-10-CM | POA: Insufficient documentation

## 2016-11-29 LAB — BASIC METABOLIC PANEL
Anion gap: 7 (ref 5–15)
BUN: 13 mg/dL (ref 6–20)
CALCIUM: 9.2 mg/dL (ref 8.9–10.3)
CO2: 26 mmol/L (ref 22–32)
CREATININE: 0.69 mg/dL (ref 0.44–1.00)
Chloride: 110 mmol/L (ref 101–111)
GFR calc Af Amer: >60 mL/min (ref >60)
GFR calc non Af Amer: >60 mL/min (ref >60)
Glucose, Bld: 105 mg/dL — ABNORMAL HIGH (ref 65–99)
Potassium: 3.6 mmol/L (ref 3.5–5.1)
Sodium: 143 mmol/L (ref 135–145)

## 2016-11-29 LAB — CBC WITH DIFFERENTIAL/PLATELET
Basophils Absolute: 0 10*3/uL (ref 0.0–0.1)
Basophils Relative: 0 %
EOS PCT: 2 %
Eosinophils Absolute: 0.1 10*3/uL (ref 0.0–0.7)
HCT: 34.9 % — ABNORMAL LOW (ref 36.0–46.0)
Hemoglobin: 11.4 g/dL — ABNORMAL LOW (ref 12.0–15.0)
Lymphocytes Relative: 28 %
Lymphs Abs: 1.5 10*3/uL (ref 0.7–4.0)
MCH: 27.7 pg (ref 26.0–34.0)
MCHC: 32.7 g/dL (ref 30.0–36.0)
MCV: 84.7 fL (ref 78.0–100.0)
MONO ABS: 0.3 10*3/uL (ref 0.1–1.0)
MONOS PCT: 7 %
Neutro Abs: 3.3 10*3/uL (ref 1.7–7.7)
Neutrophils Relative %: 63 %
PLATELETS: 239 10*3/uL (ref 150–400)
RBC: 4.12 MIL/uL (ref 3.87–5.11)
RDW: 13.8 % (ref 11.5–15.5)
WBC: 5.2 10*3/uL (ref 4.0–10.5)

## 2016-11-29 LAB — I-STAT BETA HCG BLOOD, ED (MC, WL, AP ONLY): I-stat hCG, quantitative: <5 m[IU]/mL (ref <5)

## 2016-11-29 NOTE — ED Triage Notes (Signed)
Pt reports vaginal bleeding and pain for several weeks. Pt [facility] Pt reports that she was unable to follow up with the GYN  Due to insurance.

## 2016-11-29 NOTE — ED Notes (Signed)
No answer in lobby for reassesment

## 2016-11-29 NOTE — ED Notes (Signed)
Pt called to reassess vitals x2, no answer.

## 2017-05-19 ENCOUNTER — Encounter (HOSPITAL_COMMUNITY): Payer: Self-pay | Admitting: Emergency Medicine

## 2017-05-19 ENCOUNTER — Emergency Department (HOSPITAL_COMMUNITY)
Admission: EM | Admit: 2017-05-19 | Discharge: 2017-05-19 | Discharge status: Against Medical Advice | Payer: Medicaid Other | Attending: Emergency Medicine | Admitting: Emergency Medicine

## 2017-05-19 DIAGNOSIS — R1084 Generalized abdominal pain: Secondary | ICD-10-CM | POA: Diagnosis present

## 2017-05-19 DIAGNOSIS — R109 Unspecified abdominal pain: Secondary | ICD-10-CM

## 2017-05-19 DIAGNOSIS — Z5321 Procedure and treatment not carried out due to patient leaving prior to being seen by health care provider: Secondary | ICD-10-CM | POA: Insufficient documentation

## 2017-05-19 NOTE — ED Notes (Signed)
Pt not in w room when called

## 2017-05-19 NOTE — ED Triage Notes (Signed)
Pt reports a "knot" on her right lower side.  Unable to palpate any knot.  Pt states she just got off her period and was having sex and felt a pop and then started bleeding again with this knot.  Also feels pelvic pressure.

## 2017-05-19 NOTE — ED Notes (Signed)
Pt not in waiting area at this time x 1

## 2017-05-25 ENCOUNTER — Emergency Department (HOSPITAL_COMMUNITY)
Admission: EM | Admit: 2017-05-25 | Discharge: 2017-05-26 | Disposition: A | Discharge status: Home / Self Care | Payer: Medicaid Other | Attending: Emergency Medicine | Admitting: Emergency Medicine

## 2017-05-25 ENCOUNTER — Encounter (HOSPITAL_COMMUNITY): Payer: Self-pay | Admitting: Emergency Medicine

## 2017-05-25 DIAGNOSIS — N3090 Cystitis, unspecified without hematuria: Secondary | ICD-10-CM | POA: Insufficient documentation

## 2017-05-25 DIAGNOSIS — N939 Abnormal uterine and vaginal bleeding, unspecified: Secondary | ICD-10-CM | POA: Insufficient documentation

## 2017-05-25 DIAGNOSIS — F17210 Nicotine dependence, cigarettes, uncomplicated: Secondary | ICD-10-CM | POA: Diagnosis not present

## 2017-05-25 DIAGNOSIS — Z79899 Other long term (current) drug therapy: Secondary | ICD-10-CM | POA: Diagnosis not present

## 2017-05-25 DIAGNOSIS — R1013 Epigastric pain: Secondary | ICD-10-CM | POA: Insufficient documentation

## 2017-05-25 LAB — CBC WITH DIFFERENTIAL/PLATELET
BASOS ABS: 0 10*3/uL (ref 0.0–0.1)
Basophils Relative: 0 %
EOS ABS: 0.1 10*3/uL (ref 0.0–0.7)
Eosinophils Relative: 2 %
HCT: 35.9 % — ABNORMAL LOW (ref 36.0–46.0)
Hemoglobin: 12.1 g/dL (ref 12.0–15.0)
Lymphocytes Relative: 40 %
Lymphs Abs: 2.4 10*3/uL (ref 0.7–4.0)
MCH: 29.5 pg (ref 26.0–34.0)
MCHC: 33.7 g/dL (ref 30.0–36.0)
MCV: 87.6 fL (ref 78.0–100.0)
Monocytes Absolute: 0.4 10*3/uL (ref 0.1–1.0)
Monocytes Relative: 6 %
NEUTROS PCT: 52 %
Neutro Abs: 3.1 10*3/uL (ref 1.7–7.7)
Platelets: 240 10*3/uL (ref 150–400)
RBC: 4.1 MIL/uL (ref 3.87–5.11)
RDW: 13.2 % (ref 11.5–15.5)
WBC: 6 10*3/uL (ref 4.0–10.5)

## 2017-05-25 LAB — COMPREHENSIVE METABOLIC PANEL
ALT: 26 U/L (ref 14–54)
ANION GAP: 8 (ref 5–15)
AST: 26 U/L (ref 15–41)
Albumin: 4 g/dL (ref 3.5–5.0)
Alkaline Phosphatase: 84 U/L (ref 38–126)
BUN: 16 mg/dL (ref 6–20)
CALCIUM: 9.3 mg/dL (ref 8.9–10.3)
CHLORIDE: 104 mmol/L (ref 101–111)
CO2: 28 mmol/L (ref 22–32)
CREATININE: 0.68 mg/dL (ref 0.44–1.00)
Glucose, Bld: 81 mg/dL (ref 65–99)
Potassium: 3.7 mmol/L (ref 3.5–5.1)
Sodium: 140 mmol/L (ref 135–145)
Total Bilirubin: 0.3 mg/dL (ref 0.3–1.2)
Total Protein: 7 g/dL (ref 6.5–8.1)

## 2017-05-25 LAB — URINALYSIS, ROUTINE W REFLEX MICROSCOPIC
Bilirubin Urine: NEGATIVE
Glucose, UA: NEGATIVE mg/dL
Ketones, ur: NEGATIVE mg/dL
NITRITE: POSITIVE — AB
PH: 6 (ref 5.0–8.0)
Protein, ur: NEGATIVE mg/dL
Specific Gravity, Urine: 1.023 (ref 1.005–1.030)

## 2017-05-25 LAB — LIPASE, BLOOD: LIPASE: 30 U/L (ref 11–51)

## 2017-05-25 LAB — PREGNANCY, URINE: Preg Test, Ur: NEGATIVE

## 2017-05-25 MED ORDER — NITROFURANTOIN MONOHYD MACRO 100 MG PO CAPS
ORAL_CAPSULE | ORAL | Status: AC
  Administered 2017-05-26: 100 mg
  Filled 2017-05-25: qty 1

## 2017-05-25 MED ORDER — NITROFURANTOIN MONOHYD MACRO 100 MG PO CAPS: 100.0000 mg | ORAL_CAPSULE | Freq: Two times a day (BID) | ORAL | 0 refills | Status: DC

## 2017-05-25 MED ORDER — ACETAMINOPHEN 325 MG PO TABS
650.0000 mg | ORAL_TABLET | Freq: Once | ORAL | Status: AC
  Administered 2017-05-26: 650 mg via ORAL
  Filled 2017-05-25: qty 2

## 2017-05-25 MED ORDER — NITROFURANTOIN MACROCRYSTAL 100 MG PO CAPS
100.0000 mg | ORAL_CAPSULE | Freq: Once | ORAL | Status: DC
  Filled 2017-05-25: qty 1

## 2017-05-25 MED ORDER — GI COCKTAIL ~~LOC~~
30.0000 mL | Freq: Once | ORAL | Status: AC
  Administered 2017-05-25: 30 mL via ORAL
  Filled 2017-05-25: qty 30

## 2017-05-25 NOTE — Discharge Instructions (Signed)
Drink plenty of water and cranberry juice.  Take the antibiotic as directed until its finished.  Also take OTC zantac or pepcid twice a day for 2 weeks.  Follow-up with your doctor for recheck or return here for any worsening symtpoms

## 2017-05-25 NOTE — ED Triage Notes (Signed)
"  knots in my stomach" for "a week or two"  No PCP

## 2017-05-25 NOTE — ED Triage Notes (Signed)
Pt c/o knots in stomach with nausea. Pt states she was having sex 2 days ago and felt something pop then had vaginal/rectal bleeding. Pt states the pain feels like contractions.

## 2017-05-26 NOTE — ED Notes (Signed)
Pt ambulatory to waiting room. Pt verbalized understanding of discharge instructions.   

## 2017-05-26 NOTE — ED Provider Notes (Signed)
[facility]-EMERGENCY DEPT Provider Note   CSN: [id number] Arrival date & time: 05/25/17  2123     History   Chief Complaint Chief Complaint  Patient presents with  . Abdominal Pain    HPI Laurie Norris is a 26 y.o. female.  HPI  Laurie Norris is a 26 y.o. female who presents to the Emergency Department complaining of recurrent abdominal pain.  She describes "feeling knots" to her upper abdomen and sensitivity of the skin of her abdomen.  Symptoms associated with nausea and intermittently radiate to her upper back.  She was here one week ago for same, but did not stay to be evaluated.  She also complains of feeling something "pop" two days while having sex and then noticed a brief episode of vaginal bleeding.  Denies bleeding since then.  Also denies fever, vomiting, pelvic pain, vaginal discharge and urinary symptoms.  Past Medical History:  Diagnosis Date  . Chronic back pain 10/26/2014  . History of narcotic use 10/26/2014  . Pregnant 10/26/2014  . Scoliosis     Patient Active Problem List   Diagnosis Date Noted  . Post-dates pregnancy 04/16/2015  . 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 H/O macrosomia in infant in prior pregnancy, currently pregnant 01/18/2015  . History of preterm delivery, currently pregnant 01/18/2015  . Rh negative state in antepartum period 11/04/2014  . Cocaine use complicating pregnancy 11/04/2014  . Marijuana use 11/04/2014  . Supervision of other high-risk pregnancy 11/03/2014  . Smoker 11/03/2014  . Chronic back pain 10/26/2014  . History of narcotic use 10/26/2014  . 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 BUCKET HANDLE TEAR OF LATERAL MENISCUS 09/27/2009  . KNEE JOINT INSTABILITY 09/01/2009    Past Surgical History:  Procedure Laterality Date  . KNEE SURGERY    . leg surgery from trauma    . TUBAL LIGATION Bilateral 04/17/2015   Procedure: POST PARTUM TUBAL LIGATION;  Surgeon: Adam PhenixJames G Arnold, MD;  Location: [facility];  Service:  Gynecology;  Laterality: Bilateral;    OB History    Gravida Para Term Preterm AB Living   4 4 3 1   2    SAB TAB Ectopic Multiple Live Births         0 2      Obstetric Comments   No GDM       Home Medications    Prior to Admission medications   Medication Sig Start Date End Date Taking? Authorizing Provider  Omega-3 Fatty Acids (FISH OIL) 1000 MG CAPS Take 1 capsule by mouth daily.   Yes [provider]  SUBOXONE 8-2 MG FILM Place 1 Film under the tongue 2 (two) times daily. 04/02/17  Yes [provider]  nitrofurantoin, macrocrystal-monohydrate, (MACROBID) 100 MG capsule Take 1 capsule (100 mg total) by mouth 2 (two) times daily. 05/25/17   Pauline Ausriplett, Perri Aragones, PA-C    Family History Family History  Problem Relation Age of Onset  . Diabetes Brother   . COPD Maternal Grandmother   . Arthritis Maternal Grandfather     Social History Social History  Substance Use Topics  . Smoking status: Current Every Day Smoker    Packs/day: 1.00    Years: 4.00    Types: Cigarettes  . Smokeless tobacco: Never Used  . Alcohol use No     Allergies   Penicillins   Review of Systems Review of Systems  Constitutional: Negative for appetite change, chills and fever.  Respiratory: Negative for shortness of breath.  Cardiovascular: Negative for chest pain.  Gastrointestinal: Positive for abdominal pain and nausea. Negative for blood in stool, constipation, diarrhea and vomiting.  Genitourinary: Positive for vaginal bleeding. Negative for decreased urine volume, difficulty urinating, dysuria, flank pain, pelvic pain, vaginal discharge and vaginal pain.  Musculoskeletal: Positive for back pain.  Skin: Negative for color change and rash.  Neurological: Negative for dizziness, weakness and numbness.  Hematological: Negative for adenopathy.  All other systems reviewed and are negative.    Physical Exam Updated Vital Signs BP 100/70 (BP Location: Left Arm)   Pulse 63    Temp 98.4 F (36.9 C)   Resp 18   Ht 5\' 4"  (1.626 m)   Wt 70.3 kg (155 lb)   LMP 05/11/2017   SpO2 98%   BMI 26.61 kg/m   Physical Exam  Constitutional: She appears well-developed and well-nourished. No distress.  HENT:  Head: Atraumatic.  Mouth/Throat: Oropharynx is clear and moist.  Neck: Normal range of motion.  Cardiovascular: Normal rate and regular rhythm.   Pulmonary/Chest: Effort normal. No respiratory distress.  Abdominal: Soft. She exhibits no distension and no mass. There is tenderness. There is no guarding.  Abdomen is soft, mild tenderness to palpation of the epigastric area.  No palpable masses.  No guarding or rebound tenderness.  Musculoskeletal: Normal range of motion.  Neurological: She is alert. No sensory deficit.  Skin: Skin is warm. No rash noted.  Psychiatric: She has a normal mood and affect.  Nursing note and vitals reviewed.    ED Treatments / Results  Labs (all labs ordered are listed, but only abnormal results are displayed) Labs Reviewed  CBC WITH DIFFERENTIAL/PLATELET - Abnormal; Notable for the following:       Result Value   HCT 35.9 (*)    All other components within normal limits  URINALYSIS, ROUTINE W REFLEX MICROSCOPIC - Abnormal; Notable for the following:    Hgb urine dipstick SMALL (*)    Nitrite POSITIVE (*)    Leukocytes, UA SMALL (*)    Bacteria, UA RARE (*)    Squamous Epithelial / LPF 0-5 (*)    All other components within normal limits  URINE CULTURE  COMPREHENSIVE METABOLIC PANEL  LIPASE, BLOOD  PREGNANCY, URINE    EKG  EKG Interpretation None       Radiology No results found.  Procedures Procedures (including critical care time)  Medications Ordered in ED Medications  gi cocktail (Maalox,Lidocaine,Donnatal) (30 mLs Oral Given 05/25/17 2231)  acetaminophen (TYLENOL) tablet 650 mg (650 mg Oral Given 05/26/17 0001)  nitrofurantoin (macrocrystal-monohydrate) (MACROBID) 100 MG capsule (100 mg  Given 05/26/17 0001)      Initial Impression / Assessment and Plan / ED Course  I have reviewed the triage vital signs and the nursing notes.  Pertinent labs & imaging results that were available during my care of the patient were reviewed by me and considered in my medical decision making (see chart for details).     Pt is well appearing.  Mucous members are moist.  Vitals stable.  Mild tenderness of the epigastric area of the abdomen.  No concerning sx's for acute abdomen.  Likely cystitis, culture pending. Pt reports feeling better after GI cocktail.  Will treat with macrobid, also advised pt to take zantac or pepcid twice daily for 2 weeks.  Pt appears stable for d/c, return precautions discussed.   Final Clinical Impressions(s) / ED Diagnoses   Final diagnoses:  Epigastric pain  Cystitis    New Prescriptions  Discharge Medication List as of 05/25/2017 11:53 PM    START taking these medications   Details  nitrofurantoin, macrocrystal-monohydrate, (MACROBID) 100 MG capsule Take 1 capsule (100 mg total) by mouth 2 (two) times daily., Starting Fri 05/25/2017, Print         Jana Swartzlander, PA-C 05/26/17 1349    Vanetta MuldersZackowski, Scott, MD 05/26/17 [phone]

## 2017-05-29 LAB — URINE CULTURE: Culture: >=100000 — AB

## 2017-05-30 ENCOUNTER — Telehealth: Payer: Self-pay | Admitting: Emergency Medicine

## 2017-05-30 NOTE — Telephone Encounter (Signed)
Post ED Visit - Positive Culture Follow-up  Culture report reviewed by antimicrobial stewardship pharmacist:  []  Enzo BiNathan Batchelder, Pharm.D. []  Celedonio MiyamotoJeremy Frens, Pharm.D., BCPS AQ-ID []  Garvin FilaMike Maccia, Pharm.D., BCPS []  Georgina PillionElizabeth Martin, Pharm.D., BCPS []  MacedoniaMinh Pham, VermontPharm.D., BCPS, AAHIVP []  Estella HuskMichelle Turner, Pharm.D., BCPS, AAHIVP []  Lysle Pearlachel Rumbarger, PharmD, BCPS []  Casilda Carlsaylor Stone, PharmD, BCPS []  Pollyann SamplesAndy Johnston, PharmD, BCPS [facility] PharmD  Positive urine culture Treated with nitrofurantoin, organism sensitive to the same and no further patient follow-up is required at this time.  Berle MullMiller, Nevae Pinnix 05/30/2017, 11:39 AM

## 2018-04-30 ENCOUNTER — Emergency Department (HOSPITAL_COMMUNITY)
Admission: EM | Admit: 2018-04-30 | Discharge: 2018-04-30 | Disposition: A | Discharge status: Home / Self Care | Payer: Medicaid Other | Attending: Emergency Medicine | Admitting: Emergency Medicine

## 2018-04-30 ENCOUNTER — Encounter (HOSPITAL_COMMUNITY): Payer: Self-pay | Admitting: Emergency Medicine

## 2018-04-30 DIAGNOSIS — H6011 Cellulitis of right external ear: Secondary | ICD-10-CM | POA: Diagnosis not present

## 2018-04-30 DIAGNOSIS — F17210 Nicotine dependence, cigarettes, uncomplicated: Secondary | ICD-10-CM | POA: Diagnosis not present

## 2018-04-30 DIAGNOSIS — H9201 Otalgia, right ear: Secondary | ICD-10-CM | POA: Diagnosis present

## 2018-04-30 LAB — POC URINE PREG, ED: Preg Test, Ur: NEGATIVE

## 2018-04-30 MED ORDER — DOXYCYCLINE HYCLATE 100 MG PO CAPS: 100.0000 mg | ORAL_CAPSULE | Freq: Two times a day (BID) | ORAL | 0 refills | Status: DC

## 2018-04-30 NOTE — ED Provider Notes (Signed)
[facility] EMERGENCY DEPARTMENT Provider Note   CSN: [id number] Arrival date & time: 04/30/18  1430     History   Chief Complaint Chief Complaint  Patient presents with  . Abscess    HPI Laurie Norris is a 27 y.o. female here for evaluation of right ear pain for 1 week.  Sudden onset, gradually worsening.  Pain is moderate.  Associated with ear swelling, drainage, intermittent itching.  Denies recent trauma, skin injury, swimming, hot tubs.  Has been using peroxide throughout the day to keep it clean.  No fevers, chills, internal ear pain, facial or neck pain.  HPI  Past Medical History:  Diagnosis Date  . Chronic back pain 10/26/2014  . History of narcotic use 10/26/2014  . Pregnant 10/26/2014  . Scoliosis     Patient Active Problem List   Diagnosis Date Noted  . Post-dates pregnancy 04/16/2015  . 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 H/O macrosomia in infant in prior pregnancy, currently pregnant 01/18/2015  . History of preterm delivery, currently pregnant 01/18/2015  . Rh negative state in antepartum period 11/04/2014  . Cocaine use complicating pregnancy 11/04/2014  . Marijuana use 11/04/2014  . Supervision of other high-risk pregnancy 11/03/2014  . Smoker 11/03/2014  . Chronic back pain 10/26/2014  . History of narcotic use 10/26/2014  . 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 BUCKET HANDLE TEAR OF LATERAL MENISCUS 09/27/2009  . KNEE JOINT INSTABILITY 09/01/2009    Past Surgical History:  Procedure Laterality Date  . KNEE SURGERY    . leg surgery from trauma    . TUBAL LIGATION Bilateral 04/17/2015   Procedure: POST PARTUM TUBAL LIGATION;  Surgeon: Adam Phenix, MD;  Location: [facility];  Service: Gynecology;  Laterality: Bilateral;     OB History    Gravida  4   Para  4   Term  3   Preterm  1   AB      Living  2     SAB      TAB      Ectopic      Multiple  0   Live Births  2        Obstetric Comments  No GDM         Home  Medications    Prior to Admission medications   Medication Sig Start Date End Date Taking? Authorizing Provider  doxycycline (VIBRAMYCIN) 100 MG capsule Take 1 capsule (100 mg total) by mouth 2 (two) times daily. 04/30/18   Liberty Handy, PA-C  nitrofurantoin, macrocrystal-monohydrate, (MACROBID) 100 MG capsule Take 1 capsule (100 mg total) by mouth 2 (two) times daily. 05/25/17   Triplett, Tammy, PA-C  Omega-3 Fatty Acids (FISH OIL) 1000 MG CAPS Take 1 capsule by mouth daily.    [provider]  SUBOXONE 8-2 MG FILM Place 1 Film under the tongue 2 (two) times daily. 04/02/17   [provider]    Family History Family History  Problem Relation Age of Onset  . Diabetes Brother   . COPD Maternal Grandmother   . Arthritis Maternal Grandfather     Social History Social History   Tobacco Use  . Smoking status: Current Every Day Smoker    Packs/day: 1.00    Years: 4.00    Pack years: 4.00    Types: Cigarettes  . Smokeless tobacco: Never Used  Substance Use Topics  . Alcohol use: No  . Drug use: No    Types: Marijuana  Comment: last used percocet 6/17, last used marijuana with positive pregnancy test      Allergies   Penicillins   Review of Systems Review of Systems  Eyes: Positive for pain, discharge, redness and itching.  All other systems reviewed and are negative.    Physical Exam Updated Vital Signs BP 108/79 (BP Location: Left Arm)   Pulse 85   Temp 98.6 F (37 C) (Oral)   Resp 16   Ht 5\' 4"  (1.626 m)   Wt 58.5 kg (129 lb)   LMP 03/17/2018   SpO2 100%   BMI 22.14 kg/m   Physical Exam  Constitutional: She is oriented to person, place, and time. She appears well-developed and well-nourished.  Non-toxic appearance.  HENT:  Head: Normocephalic.  Right Ear: External ear normal.  Left Ear: External ear normal.  Nose: Nose normal.  Bilateral tympanic membranes well visualized, pearly gray with good cone of light and bony landmarks noted.   No significant erythema, edema, drainage to the ear canals bilaterally.  Eyes: Conjunctivae and EOM are normal.  Neck: Full passive range of motion without pain.  Cardiovascular: Normal rate.  Pulmonary/Chest: Effort normal. No tachypnea. No respiratory distress.  Musculoskeletal: Normal range of motion.  Neurological: She is alert and oriented to person, place, and time.  Skin: Skin is warm and dry. Capillary refill takes less than 2 seconds.  Skin over the the top of the pinna is erythematous, warm, edematous and tender.  Skin over the earlobe looks less inflamed and tender.  Multiple honey colored crusting over superficial abrasions of the right ear.  Skin to the inner folds of the external ear moist with clear/yellow drainage oozing.  No significant pain with external ear manipulation.  No mastoid tenderness, edema or abnormal ear protrusion.  Psychiatric: Her behavior is normal. Thought content normal.       ED Treatments / Results  Labs (all labs ordered are listed, but only abnormal results are displayed) Labs Reviewed  POC URINE PREG, ED    EKG None  Radiology No results found.  Procedures Procedures (including critical care time)  Medications Ordered in ED Medications - No data to display   Initial Impression / Assessment and Plan / ED Course  I have reviewed the triage vital signs and the nursing notes.  Pertinent labs & imaging results that were available during my care of the patient were reviewed by me and considered in my medical decision making (see chart for details).     Exam most consistent with cellulitis given redness, warmth, swelling, drainage.  Did consider yeast given moist appearance and itching although this is considered less likely, she is immunocompetent.  No systemic symptoms of infection.  No signs of otitis media, mastoiditis, significant facial or neck swelling.  Will discharge with oral antibiotics, topical bacitracin/hydrocortisone for  associated mild itching and inflammation.  Recommended she stop peroxide rinses.  Recommended she did not go swimming.  NSAIDs for local pain.  Discussed return precautions. Final Clinical Impressions(s) / ED Diagnoses   Final diagnoses:  Cellulitis of auricle of right ear    ED Discharge Orders        Ordered    doxycycline (VIBRAMYCIN) 100 MG capsule  2 times daily     04/30/18 1558       Liberty HandyGibbons, Etter Royall J, [place]PA-C 04/30/18 1734    Bethann BerkshireZammit, Joseph, MD 05/01/18 1529

## 2018-04-30 NOTE — Discharge Instructions (Signed)
You were seen in the ER for your redness, pain, swelling, drainage.  The cause is unclear but it looks like there may be a superimposed infection.  We will treat this with doxycycline.  Take this until completed.  Stop using peroxide.  You may rinse with clean water and soap, keep dry.  For itching and local pain you  can apply thin layer of over-the-counter hydrocortisone cream.  Can take ibuprofen/acetaminophen for pain.  Return to the ER for worsening swelling, pain, redness, pus, fevers, facial or neck swelling.

## 2018-04-30 NOTE — ED Triage Notes (Deleted)
Patient states her doctor sent her [place]

## 2018-04-30 NOTE — ED Triage Notes (Signed)
Patient complaining of abscess to outer right ear x 2 days.

## 2018-04-30 NOTE — ED Notes (Signed)
Patient given discharge instruction, verbalized understand. Patient ambulatory out of the department.  

## 2019-04-11 ENCOUNTER — Other Ambulatory Visit: Payer: Self-pay

## 2019-04-11 ENCOUNTER — Other Ambulatory Visit (HOSPITAL_COMMUNITY)
Admission: RE | Admit: 2019-04-11 | Discharge: 2019-04-11 | Disposition: A | Discharge status: Home / Self Care | Payer: Medicaid Other | Source: Ambulatory Visit | Attending: Family Medicine | Admitting: Family Medicine

## 2019-04-11 DIAGNOSIS — Z130 Encounter for screening for diseases of the blood and blood-forming organs and certain disorders involving the immune mechanism: Secondary | ICD-10-CM | POA: Insufficient documentation

## 2019-04-11 DIAGNOSIS — Z113 Encounter for screening for infections with a predominantly sexual mode of transmission: Secondary | ICD-10-CM | POA: Insufficient documentation

## 2019-04-11 DIAGNOSIS — Z1389 Encounter for screening for other disorder: Secondary | ICD-10-CM | POA: Insufficient documentation

## 2019-04-11 DIAGNOSIS — B20 Human immunodeficiency virus [HIV] disease: Secondary | ICD-10-CM | POA: Insufficient documentation

## 2019-04-11 DIAGNOSIS — Z13220 Encounter for screening for lipoid disorders: Secondary | ICD-10-CM | POA: Insufficient documentation

## 2019-04-11 LAB — LIPID PANEL
Cholesterol: 140 mg/dL (ref 0–200)
HDL: 38 mg/dL — ABNORMAL LOW (ref >40)
LDL Cholesterol: 90 mg/dL (ref 0–99)
Total CHOL/HDL Ratio: 3.7 RATIO
Triglycerides: 60 mg/dL (ref <150)
VLDL: 12 mg/dL (ref 0–40)

## 2019-04-11 LAB — TSH: TSH: 3.454 u[IU]/mL (ref 0.350–4.500)

## 2019-04-11 LAB — COMPREHENSIVE METABOLIC PANEL
ALT: 24 U/L (ref 0–44)
AST: 23 U/L (ref 15–41)
Albumin: 3.9 g/dL (ref 3.5–5.0)
Alkaline Phosphatase: 66 U/L (ref 38–126)
Anion gap: 9 (ref 5–15)
BUN: 18 mg/dL (ref 6–20)
CO2: 26 mmol/L (ref 22–32)
Calcium: 8.9 mg/dL (ref 8.9–10.3)
Chloride: 105 mmol/L (ref 98–111)
Creatinine, Ser: 0.62 mg/dL (ref 0.44–1.00)
GFR calc Af Amer: >60 mL/min (ref >60)
GFR calc non Af Amer: >60 mL/min (ref >60)
Glucose, Bld: 89 mg/dL (ref 70–99)
Potassium: 4.1 mmol/L (ref 3.5–5.1)
Sodium: 140 mmol/L (ref 135–145)
Total Bilirubin: 0.3 mg/dL (ref 0.3–1.2)
Total Protein: 6.7 g/dL (ref 6.5–8.1)

## 2019-04-11 LAB — CBC WITH DIFFERENTIAL/PLATELET
Abs Immature Granulocytes: 0 10*3/uL (ref 0.00–0.07)
Basophils Absolute: 0 10*3/uL (ref 0.0–0.1)
Basophils Relative: 1 %
Eosinophils Absolute: 0.9 10*3/uL — ABNORMAL HIGH (ref 0.0–0.5)
Eosinophils Relative: 19 %
HCT: 35.7 % — ABNORMAL LOW (ref 36.0–46.0)
Hemoglobin: 11.8 g/dL — ABNORMAL LOW (ref 12.0–15.0)
Immature Granulocytes: 0 %
Lymphocytes Relative: 36 %
Lymphs Abs: 1.7 10*3/uL (ref 0.7–4.0)
MCH: 30.3 pg (ref 26.0–34.0)
MCHC: 33.1 g/dL (ref 30.0–36.0)
MCV: 91.5 fL (ref 80.0–100.0)
Monocytes Absolute: 0.4 10*3/uL (ref 0.1–1.0)
Monocytes Relative: 8 %
Neutro Abs: 1.7 10*3/uL (ref 1.7–7.7)
Neutrophils Relative %: 36 %
Platelets: 182 10*3/uL (ref 150–400)
RBC: 3.9 MIL/uL (ref 3.87–5.11)
RDW: 12.8 % (ref 11.5–15.5)
WBC: 4.6 10*3/uL (ref 4.0–10.5)
nRBC: 0 % (ref 0.0–0.2)

## 2019-04-11 LAB — RAPID HIV SCREEN (HIV 1/2 AB+AG)
HIV 1/2 Antibodies: NONREACTIVE
HIV-1 P24 Antigen - HIV24: NONREACTIVE

## 2019-04-14 LAB — DRUG SCREEN 10 W/CONF, SERUM
Amphetamines, IA: NEGATIVE ng/mL
Barbiturates, IA: NEGATIVE ug/mL
Benzodiazepines, IA: NEGATIVE ng/mL
Cocaine & Metabolite, IA: NEGATIVE ng/mL
Methadone, IA: NEGATIVE ng/mL
Opiates, IA: NEGATIVE ng/mL
Oxycodones, IA: NEGATIVE ng/mL
Phencyclidine, IA: NEGATIVE ng/mL
Propoxyphene, IA: NEGATIVE ng/mL
THC(Marijuana) Metabolite, IA: NEGATIVE ng/mL

## 2019-04-16 LAB — RPR: RPR Ser Ql: NONREACTIVE

## 2019-07-10 ENCOUNTER — Encounter (HOSPITAL_COMMUNITY): Payer: Self-pay | Admitting: Emergency Medicine

## 2019-07-10 ENCOUNTER — Emergency Department (HOSPITAL_COMMUNITY): Payer: Medicaid Other

## 2019-07-10 ENCOUNTER — Other Ambulatory Visit: Payer: Self-pay | Admitting: *Deleted

## 2019-07-10 ENCOUNTER — Emergency Department (HOSPITAL_COMMUNITY)
Admission: EM | Admit: 2019-07-10 | Discharge: 2019-07-10 | Disposition: A | Discharge status: Home / Self Care | Payer: Medicaid Other | Attending: Emergency Medicine | Admitting: Emergency Medicine

## 2019-07-10 ENCOUNTER — Other Ambulatory Visit: Payer: Self-pay

## 2019-07-10 DIAGNOSIS — R197 Diarrhea, unspecified: Secondary | ICD-10-CM | POA: Insufficient documentation

## 2019-07-10 DIAGNOSIS — R519 Headache, unspecified: Secondary | ICD-10-CM

## 2019-07-10 DIAGNOSIS — R112 Nausea with vomiting, unspecified: Secondary | ICD-10-CM | POA: Insufficient documentation

## 2019-07-10 DIAGNOSIS — Z79899 Other long term (current) drug therapy: Secondary | ICD-10-CM | POA: Diagnosis not present

## 2019-07-10 DIAGNOSIS — F17210 Nicotine dependence, cigarettes, uncomplicated: Secondary | ICD-10-CM | POA: Insufficient documentation

## 2019-07-10 DIAGNOSIS — R51 Headache: Secondary | ICD-10-CM | POA: Diagnosis not present

## 2019-07-10 DIAGNOSIS — L731 Pseudofolliculitis barbae: Secondary | ICD-10-CM | POA: Insufficient documentation

## 2019-07-10 DIAGNOSIS — Z20822 Contact with and (suspected) exposure to covid-19: Secondary | ICD-10-CM

## 2019-07-10 DIAGNOSIS — Z20828 Contact with and (suspected) exposure to other viral communicable diseases: Secondary | ICD-10-CM | POA: Insufficient documentation

## 2019-07-10 LAB — COMPREHENSIVE METABOLIC PANEL
ALT: 95 U/L — ABNORMAL HIGH (ref 0–44)
AST: 76 U/L — ABNORMAL HIGH (ref 15–41)
Albumin: 4 g/dL (ref 3.5–5.0)
Alkaline Phosphatase: 144 U/L — ABNORMAL HIGH (ref 38–126)
Anion gap: 7 (ref 5–15)
BUN: 12 mg/dL (ref 6–20)
CO2: 25 mmol/L (ref 22–32)
Calcium: 8.9 mg/dL (ref 8.9–10.3)
Chloride: 107 mmol/L (ref 98–111)
Creatinine, Ser: 0.65 mg/dL (ref 0.44–1.00)
GFR calc Af Amer: >60 mL/min (ref >60)
GFR calc non Af Amer: >60 mL/min (ref >60)
Glucose, Bld: 97 mg/dL (ref 70–99)
Potassium: 3.6 mmol/L (ref 3.5–5.1)
Sodium: 139 mmol/L (ref 135–145)
Total Bilirubin: <0.1 mg/dL — ABNORMAL LOW (ref 0.3–1.2)
Total Protein: 7.4 g/dL (ref 6.5–8.1)

## 2019-07-10 LAB — CBC WITH DIFFERENTIAL/PLATELET
Abs Immature Granulocytes: 0.02 10*3/uL (ref 0.00–0.07)
Basophils Absolute: 0 10*3/uL (ref 0.0–0.1)
Basophils Relative: 1 %
Eosinophils Absolute: 0.3 10*3/uL (ref 0.0–0.5)
Eosinophils Relative: 6 %
HCT: 36.7 % (ref 36.0–46.0)
Hemoglobin: 11.8 g/dL — ABNORMAL LOW (ref 12.0–15.0)
Immature Granulocytes: 0 %
Lymphocytes Relative: 33 %
Lymphs Abs: 1.7 10*3/uL (ref 0.7–4.0)
MCH: 30 pg (ref 26.0–34.0)
MCHC: 32.2 g/dL (ref 30.0–36.0)
MCV: 93.4 fL (ref 80.0–100.0)
Monocytes Absolute: 0.5 10*3/uL (ref 0.1–1.0)
Monocytes Relative: 10 %
Neutro Abs: 2.6 10*3/uL (ref 1.7–7.7)
Neutrophils Relative %: 50 %
Platelets: 260 10*3/uL (ref 150–400)
RBC: 3.93 MIL/uL (ref 3.87–5.11)
RDW: 12.5 % (ref 11.5–15.5)
WBC: 5.2 10*3/uL (ref 4.0–10.5)
nRBC: 0 % (ref 0.0–0.2)

## 2019-07-10 LAB — URINALYSIS, ROUTINE W REFLEX MICROSCOPIC
Bacteria, UA: NONE SEEN
Bilirubin Urine: NEGATIVE
Glucose, UA: NEGATIVE mg/dL
Hgb urine dipstick: NEGATIVE
Ketones, ur: NEGATIVE mg/dL
Nitrite: NEGATIVE
Protein, ur: NEGATIVE mg/dL
Specific Gravity, Urine: 1.02 (ref 1.005–1.030)
pH: 5 (ref 5.0–8.0)

## 2019-07-10 LAB — LIPASE, BLOOD: Lipase: 28 U/L (ref 11–51)

## 2019-07-10 LAB — I-STAT BETA HCG BLOOD, ED (MC, WL, AP ONLY): I-stat hCG, quantitative: <5 m[IU]/mL (ref <5)

## 2019-07-10 MED ORDER — BACITRACIN ZINC 500 UNIT/GM EX OINT: 1.0000 "application " | TOPICAL_OINTMENT | Freq: Two times a day (BID) | CUTANEOUS | 0 refills | Status: DC

## 2019-07-10 MED ORDER — ONDANSETRON HCL 4 MG/2ML IJ SOLN
4.0000 mg | Freq: Once | INTRAMUSCULAR | Status: AC
  Administered 2019-07-10: 4 mg via INTRAVENOUS
  Filled 2019-07-10: qty 2

## 2019-07-10 MED ORDER — ONDANSETRON 4 MG PO TBDP: 4.0000 mg | ORAL_TABLET | Freq: Three times a day (TID) | ORAL | 0 refills | Status: DC | PRN

## 2019-07-10 MED ORDER — SODIUM CHLORIDE 0.9 % IV BOLUS
1000.0000 mL | Freq: Once | INTRAVENOUS | Status: AC
  Administered 2019-07-10: 1000 mL via INTRAVENOUS

## 2019-07-10 NOTE — Discharge Instructions (Signed)
Take the medications as prescribed. Make sure to take adequate fluids.  They will call you with your COVID test

## 2019-07-10 NOTE — ED Provider Notes (Signed)
[facility]   CSN: [id number] Arrival date & time: 07/10/19  1745    History   Chief Complaint Chief Complaint  Patient presents with  . Emesis    HPI Laurie Norris is a 28 y.o. female with past medical history significant for chronic back pain, drug dependence who presents for evaluation of multiple complaints.  Patient states she has had nausea, vomiting, diarrhea, headache, cough, chills over the last 4 days.  Patient states she has not been able to tolerate much p.o. intake due to chronic emesis.  States this morning she began having a frontal headache.  Denies sudden onset thunderclap headache.  She is also had a cough productive of green sputum x4 days.  Her best friend's test positive for COVID last week however patient states she has not been around "close contact."  With friend.  Denies fever, chills, new lateral weakness, drooling, dysphasia, trismus, chest pain, shortness of breath, pelvic pain, abdominal pain, vaginal discharge, concerns for STDs.  Patient denies chance of pregnancy.  Has not taken anything for symptoms.  Emesis NBNB.Denies additional aggravating or alleviating factors.  COVID testing outpatient facility today.  History obtained from patient and past medical records.  No interpreter is used.     HPI  Past Medical History:  Diagnosis Date  . Chronic back pain 10/26/2014  . History of narcotic use 10/26/2014  . Pregnant 10/26/2014  . Scoliosis     Patient Active Problem List   Diagnosis Date Noted  . Post-dates pregnancy 04/16/2015  . 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 H/O macrosomia in infant in prior pregnancy, currently pregnant 01/18/2015  . History of preterm delivery, currently pregnant 01/18/2015  . Rh negative state in antepartum period 11/04/2014  . Cocaine use complicating pregnancy 11/04/2014  . Marijuana use 11/04/2014  . Supervision of other high-risk pregnancy 11/03/2014  . Smoker  11/03/2014  . Chronic back pain 10/26/2014  . History of narcotic use 10/26/2014  . 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 BUCKET HANDLE TEAR OF LATERAL MENISCUS 09/27/2009  . KNEE JOINT INSTABILITY 09/01/2009    Past Surgical History:  Procedure Laterality Date  . KNEE SURGERY    . leg surgery from trauma    . TUBAL LIGATION Bilateral 04/17/2015   Procedure: POST PARTUM TUBAL LIGATION;  Surgeon: Adam PhenixJames G Arnold, MD;  Location: [facility];  Service: Gynecology;  Laterality: Bilateral;     OB History    Gravida  4   Para  4   Term  3   Preterm  1   AB      Living  2     SAB      TAB      Ectopic      Multiple  0   Live Births  2        Obstetric Comments  No GDM         Home Medications    Prior to Admission medications   Medication Sig Start Date End Date Taking? Authorizing Provider  bacitracin ointment Apply 1 application topically 2 (two) times daily. 07/10/19   Armon Orvis A, PA-C  Omega-3 Fatty Acids (FISH OIL) 1000 MG CAPS Take 1 capsule by mouth daily.    [provider]  ondansetron (ZOFRAN ODT) 4 MG disintegrating tablet Take 1 tablet (4 mg total) by mouth every 8 (eight) hours as needed for nausea or vomiting. 07/10/19   Mikaelah Trostle A, PA-C  SUBOXONE 8-2 MG FILM  Place 1 Film under the tongue 2 (two) times daily. 04/02/17   [provider]    Family History Family History  Problem Relation Age of Onset  . Diabetes Brother   . COPD Maternal Grandmother   . Arthritis Maternal Grandfather     Social History Social History   Tobacco Use  . Smoking status: Current Every Day Smoker    Packs/day: 1.00    Years: 4.00    Pack years: 4.00    Types: Cigarettes  . Smokeless tobacco: Never Used  Substance Use Topics  . Alcohol use: No  . Drug use: No    Types: Marijuana    Comment: last used percocet 6/17, last used marijuana with positive pregnancy test      Allergies   Penicillins   Review of Systems Review of Systems   Constitutional: Positive for appetite change and chills. Negative for fatigue and fever.  HENT: Negative.   Respiratory: Positive for cough. Negative for choking, chest tightness, shortness of breath, wheezing and stridor.   Cardiovascular: Negative.   Gastrointestinal: Positive for diarrhea, nausea and vomiting. Negative for abdominal distention, abdominal pain, blood in stool, constipation and rectal pain.  Genitourinary: Negative.   Musculoskeletal: Positive for myalgias. Negative for arthralgias, back pain, gait problem, joint swelling, neck pain and neck stiffness.  Skin: Negative.   Neurological: Positive for headaches. Negative for dizziness, tremors, syncope, speech difficulty, weakness, light-headedness and numbness.  All other systems reviewed and are negative.   Physical Exam Updated Vital Signs BP 122/78   Pulse 86   Temp 98.3 F (36.8 C) (Oral)   Resp 18   Ht 5\' 2"  (1.575 m)   Wt 54.4 kg   SpO2 100%   BMI 21.95 kg/m   Physical Exam Vitals signs and nursing note reviewed.  Constitutional:      General: She is not in acute distress.    Appearance: She is well-developed. She is not ill-appearing, toxic-appearing or diaphoretic.  HENT:     Head: Normocephalic and atraumatic.     Jaw: There is normal jaw occlusion.     Right Ear: Tympanic membrane, ear canal and external ear normal. There is no impacted cerumen. No hemotympanum. Tympanic membrane is not injected, scarred, perforated, erythematous, retracted or bulging.     Left Ear: Tympanic membrane, ear canal and external ear normal. There is no impacted cerumen. No hemotympanum. Tympanic membrane is not injected, scarred, perforated, erythematous, retracted or bulging.     Ears:     Comments: No Mastoid tenderness.    Nose: Nose normal.     Comments: Clear rhinorrhea and congestion to bilateral nares.  No sinus tenderness.    Mouth/Throat:     Mouth: Mucous membranes are moist.     Pharynx: Oropharynx is clear.      Comments: Posterior oropharynx clear.  Mucous membranes moist.  Tonsils without erythema or exudate.  Uvula midline without deviation.  No evidence of PTA or RPA.  No drooling, dysphasia or trismus.  Phonation normal. Eyes:     Pupils: Pupils are equal, round, and reactive to light.     Comments: No horizontal, vertical or rotational nystagmus   Neck:     Musculoskeletal: Normal range of motion.     Trachea: Trachea and phonation normal.     Meningeal: Brudzinski's sign and Kernig's sign absent.     Comments: Full active and passive ROM without pain No midline or paraspinal tenderness No nuchal rigidity or meningeal signs  Cardiovascular:  Rate and Rhythm: Normal rate.     Comments: No murmurs rubs or gallops. Pulmonary:     Effort: No respiratory distress.     Comments: Clear to auscultation bilaterally without wheeze, rhonchi or rales.  No accessory muscle usage.  Able speak in full sentences. Abdominal:     General: There is no distension.     Comments: Soft, nontender without rebound or guarding.  No CVA tenderness.  Musculoskeletal: Normal range of motion.     Comments: Moves all 4 extremities without difficulty.  Lower extremities without edema, erythema or warmth.  Skin:    General: Skin is warm and dry.     Comments: Brisk capillary refill.  No rashes or lesions.  Neurological:     Mental Status: She is alert.     Comments: Mental Status:  Alert, oriented, thought content appropriate. Speech fluent without evidence of aphasia. Able to follow 2 step commands without difficulty.  Cranial Nerves:  II:  Peripheral visual fields grossly normal, pupils equal, round, reactive to light III,IV, VI: ptosis not present, extra-ocular motions intact bilaterally  V,VII: smile symmetric, facial light touch sensation equal VIII: hearing grossly normal bilaterally  IX,X: midline uvula rise  XI: bilateral shoulder shrug equal and strong XII: midline tongue extension  Motor:  5/5 in  upper and lower extremities bilaterally including strong and equal grip strength and dorsiflexion/plantar flexion Sensory: Pinprick and light touch normal in all extremities.  Deep Tendon Reflexes: 2+ and symmetric  Cerebellar: normal finger-to-nose with bilateral upper extremities Gait: normal gait and balance CV: distal pulses palpable throughout        ED Treatments / Results  Labs (all labs ordered are listed, but only abnormal results are displayed) Labs Reviewed  CBC WITH DIFFERENTIAL/PLATELET - Abnormal; Notable for the following components:      Result Value   Hemoglobin 11.8 (*)    All other components within normal limits  COMPREHENSIVE METABOLIC PANEL - Abnormal; Notable for the following components:   AST 76 (*)    ALT 95 (*)    Alkaline Phosphatase 144 (*)    Total Bilirubin <0.1 (*)    All other components within normal limits  URINALYSIS, ROUTINE W REFLEX MICROSCOPIC - Abnormal; Notable for the following components:   APPearance HAZY (*)    Leukocytes,Ua TRACE (*)    All other components within normal limits  LIPASE, BLOOD  I-STAT BETA HCG BLOOD, ED ([facility], [facility], [facility] ONLY)    EKG None  Radiology Dg Chest Portable 1 View  Result Date: 07/10/2019 CLINICAL DATA:  Cough and nausea EXAM: PORTABLE CHEST 1 VIEW COMPARISON:  None. FINDINGS: The heart size and mediastinal contours are within normal limits. Both lungs are clear. The visualized skeletal structures are unremarkable. IMPRESSION: No active disease. Electronically Signed   By: Alcide CleverMark  Lukens M.D.   On: 07/10/2019 21:51    Procedures Procedures (including critical care time)  Medications Ordered in ED Medications  sodium chloride 0.9 % bolus 1,000 mL (0 mLs Intravenous Stopped 07/10/19 2052)  ondansetron (ZOFRAN) injection 4 mg (4 mg Intravenous Given 07/10/19 1946)     Initial Impression / Assessment and Plan / ED Course  I have reviewed the triage vital signs and the nursing notes.  Pertinent labs &  imaging results that were available during my care of the patient were reviewed by me and considered in my medical decision making (see chart for details).  28 year old female appears otherwise well presents for evaluation multiple complaints.  Lungs  clear.  No neck stiffness or neck rigidity.  Nonfocal neurologic exam without deficits.  Abdomen soft, nontender without rebound or guarding.  No concerns for STDs, no pelvic pain, vaginal discharge.  Low suspicion for PID as cause of emesis.  Given multiple episodes of emesis will obtain labs, provide IV fluids, antiemetics, urinalysis.  Labs at baseline.  Plain film chest with evidence of infiltrates, cardiomegaly, pulmonary edema, pneumothorax.  Urinalysis negative for infection.  Pregnancy test negative.  Patient with improvement in symptoms with Zofran IV fluids.  She is tolerating p.o. intake without difficulty.  Patient is nontoxic, nonseptic appearing, in no apparent distress.  Patient's pain and other symptoms adequately managed in emergency department.  Fluid bolus given.  Labs, imaging and vitals reviewed.  Patient does not meet the SIRS or Sepsis criteria.  On repeat exam patient does not have a surgical abdomin and there are no peritoneal signs.  No indication of appendicitis, bowel obstruction, bowel perforation, cholecystitis, diverticulitis, PID or ectopic pregnancy.  Patient discharged home with symptomatic treatment and given strict instructions for follow-up with their primary care physician.  I have also discussed reasons to return immediately to the ER.  Patient expresses understanding and agrees with plan. Presentation non concerning for [facility], ICH, Meningitis, or temporal arteritis. Pt is afebrile with no focal neuro deficits, nuchal rigidity, or change in vision.      ANNEL ZUNKER was evaluated in Emergency Department on 07/10/2019 for the symptoms described in the history of present illness. She was evaluated in the context of the  global COVID-19 pandemic, which necessitated consideration that the patient might be at risk for infection with the SARS-CoV-2 virus that causes COVID-19. Institutional protocols and algorithms that pertain to the evaluation of patients at risk for COVID-19 are in a state of rapid change based on information released by regulatory bodies including the CDC and federal and state organizations. These policies and algorithms were followed during the patient's care in the ED. Final Clinical Impressions(s) / ED Diagnoses   Final diagnoses:  Nausea vomiting and diarrhea  Acute nonintractable headache, unspecified headache type  Close Exposure to Covid-19 Virus  Ingrown hair    ED Discharge Orders         Ordered    ondansetron (ZOFRAN ODT) 4 MG disintegrating tablet  Every 8 hours PRN     07/10/19 2230    bacitracin ointment  2 times daily     07/10/19 2230           Tulani Kidney A, PA-C 07/10/19 2247    Davonna Belling, MD 07/10/19 2345

## 2019-07-10 NOTE — ED Triage Notes (Signed)
Pt c/o of n/v/d, headache and cough x 4 days.

## 2019-07-12 LAB — NOVEL CORONAVIRUS, NAA: SARS-CoV-2, NAA: DETECTED — AB

## 2019-08-12 ENCOUNTER — Other Ambulatory Visit: Payer: Self-pay

## 2019-08-12 DIAGNOSIS — Z20822 Contact with and (suspected) exposure to covid-19: Secondary | ICD-10-CM

## 2019-08-13 LAB — NOVEL CORONAVIRUS, NAA: SARS-CoV-2, NAA: NOT DETECTED

## 2019-11-10 ENCOUNTER — Emergency Department (HOSPITAL_COMMUNITY)
Admission: EM | Admit: 2019-11-10 | Discharge: 2019-11-11 | Disposition: A | Discharge status: Home / Self Care | Payer: Medicaid Other | Attending: Emergency Medicine | Admitting: Emergency Medicine

## 2019-11-10 ENCOUNTER — Other Ambulatory Visit: Payer: Self-pay

## 2019-11-10 ENCOUNTER — Emergency Department (HOSPITAL_COMMUNITY): Payer: Medicaid Other

## 2019-11-10 ENCOUNTER — Encounter (HOSPITAL_COMMUNITY): Payer: Self-pay | Admitting: Emergency Medicine

## 2019-11-10 DIAGNOSIS — F17210 Nicotine dependence, cigarettes, uncomplicated: Secondary | ICD-10-CM | POA: Insufficient documentation

## 2019-11-10 DIAGNOSIS — B9689 Other specified bacterial agents as the cause of diseases classified elsewhere: Secondary | ICD-10-CM

## 2019-11-10 DIAGNOSIS — R1031 Right lower quadrant pain: Secondary | ICD-10-CM | POA: Diagnosis present

## 2019-11-10 DIAGNOSIS — Z79899 Other long term (current) drug therapy: Secondary | ICD-10-CM | POA: Insufficient documentation

## 2019-11-10 DIAGNOSIS — N83201 Unspecified ovarian cyst, right side: Secondary | ICD-10-CM | POA: Insufficient documentation

## 2019-11-10 DIAGNOSIS — N760 Acute vaginitis: Secondary | ICD-10-CM | POA: Diagnosis not present

## 2019-11-10 DIAGNOSIS — Z8616 Personal history of COVID-19: Secondary | ICD-10-CM | POA: Diagnosis not present

## 2019-11-10 DIAGNOSIS — N83209 Unspecified ovarian cyst, unspecified side: Secondary | ICD-10-CM

## 2019-11-10 LAB — WET PREP, GENITAL
Sperm: NONE SEEN
Trich, Wet Prep: NONE SEEN
Yeast Wet Prep HPF POC: NONE SEEN

## 2019-11-10 LAB — URINALYSIS, ROUTINE W REFLEX MICROSCOPIC
Bilirubin Urine: NEGATIVE
Glucose, UA: NEGATIVE mg/dL
Hgb urine dipstick: NEGATIVE
Ketones, ur: NEGATIVE mg/dL
Nitrite: NEGATIVE
Protein, ur: NEGATIVE mg/dL
Specific Gravity, Urine: 1.01 (ref 1.005–1.030)
pH: 7 (ref 5.0–8.0)

## 2019-11-10 LAB — CBC WITH DIFFERENTIAL/PLATELET
Abs Immature Granulocytes: 0.01 10*3/uL (ref 0.00–0.07)
Basophils Absolute: 0 10*3/uL (ref 0.0–0.1)
Basophils Relative: 0 %
Eosinophils Absolute: 0.4 10*3/uL (ref 0.0–0.5)
Eosinophils Relative: 8 %
HCT: 36.4 % (ref 36.0–46.0)
Hemoglobin: 12.3 g/dL (ref 12.0–15.0)
Immature Granulocytes: 0 %
Lymphocytes Relative: 41 %
Lymphs Abs: 2.1 10*3/uL (ref 0.7–4.0)
MCH: 31.9 pg (ref 26.0–34.0)
MCHC: 33.8 g/dL (ref 30.0–36.0)
MCV: 94.3 fL (ref 80.0–100.0)
Monocytes Absolute: 0.4 10*3/uL (ref 0.1–1.0)
Monocytes Relative: 8 %
Neutro Abs: 2.2 10*3/uL (ref 1.7–7.7)
Neutrophils Relative %: 43 %
Platelets: 120 10*3/uL — ABNORMAL LOW (ref 150–400)
RBC: 3.86 MIL/uL — ABNORMAL LOW (ref 3.87–5.11)
RDW: 12.5 % (ref 11.5–15.5)
WBC: 5 10*3/uL (ref 4.0–10.5)
nRBC: 0 % (ref 0.0–0.2)

## 2019-11-10 LAB — COMPREHENSIVE METABOLIC PANEL
ALT: 30 U/L (ref 0–44)
AST: 32 U/L (ref 15–41)
Albumin: 4.3 g/dL (ref 3.5–5.0)
Alkaline Phosphatase: 57 U/L (ref 38–126)
Anion gap: 6 (ref 5–15)
BUN: 18 mg/dL (ref 6–20)
CO2: 27 mmol/L (ref 22–32)
Calcium: 8.9 mg/dL (ref 8.9–10.3)
Chloride: 105 mmol/L (ref 98–111)
Creatinine, Ser: 0.75 mg/dL (ref 0.44–1.00)
GFR calc Af Amer: >60 mL/min (ref >60)
GFR calc non Af Amer: >60 mL/min (ref >60)
Glucose, Bld: 67 mg/dL — ABNORMAL LOW (ref 70–99)
Potassium: 3.4 mmol/L — ABNORMAL LOW (ref 3.5–5.1)
Sodium: 138 mmol/L (ref 135–145)
Total Bilirubin: 0.3 mg/dL (ref 0.3–1.2)
Total Protein: 7.6 g/dL (ref 6.5–8.1)

## 2019-11-10 LAB — LIPASE, BLOOD: Lipase: 29 U/L (ref 11–51)

## 2019-11-10 MED ORDER — MORPHINE SULFATE (PF) 4 MG/ML IV SOLN
4.0000 mg | Freq: Once | INTRAVENOUS | Status: AC
  Administered 2019-11-10: 4 mg via INTRAVENOUS
  Filled 2019-11-10: qty 1

## 2019-11-10 MED ORDER — MORPHINE SULFATE (PF) 4 MG/ML IV SOLN
4.0000 mg | Freq: Once | INTRAVENOUS | Status: AC
  Administered 2019-11-11: 4 mg via INTRAVENOUS
  Filled 2019-11-10: qty 1

## 2019-11-10 MED ORDER — ONDANSETRON HCL 4 MG/2ML IJ SOLN
4.0000 mg | Freq: Once | INTRAMUSCULAR | Status: AC
  Administered 2019-11-10: 4 mg via INTRAVENOUS
  Filled 2019-11-10: qty 2

## 2019-11-10 MED ORDER — SODIUM CHLORIDE 0.9 % IV BOLUS
1000.0000 mL | Freq: Once | INTRAVENOUS | Status: AC
  Administered 2019-11-10: 22:00:00 1000 mL via INTRAVENOUS

## 2019-11-10 MED ORDER — IOHEXOL 300 MG/ML  SOLN
100.0000 mL | Freq: Once | INTRAMUSCULAR | Status: AC | PRN
  Administered 2019-11-10: 100 mL via INTRAVENOUS

## 2019-11-10 NOTE — ED Provider Notes (Signed)
[facility] EMERGENCY DEPARTMENT Provider Note   CSN: [id number] Arrival date & time: 11/10/19  2107     History Chief Complaint  Patient presents with  . Abdominal Pain    Laurie Norris is a 29 y.o. female.  The history is provided by the patient. No language interpreter was used.  Abdominal Pain    29 year old female previously tested positive for COVID-19 back in September presenting complaining of abdominal pain.  Patient report for the past 3 days she has had pain to her right lower abdomen.  She described pain as a sharp stabbing sensation radiates from her bellybutton down to her right lower quadrant into her back.  She endorsed having chills, decrease in appetite, feeling nauseous, and overall not feeling well.  Today she also endorsed having profuse vaginal bleeding.  Pain is not adequately controlled with over-the-counter medication.  She still has an intact appendix.  She is sexually active with boyfriend, using protection.  Her last menstrual period was 10/23/2019.  No report of chest pain shortness of breath or productive cough.  Denies any vaginal discharge.  She endorsed nausea without vomiting, no diarrhea or constipation.  Past Medical History:  Diagnosis Date  . Chronic back pain 10/26/2014  . History of narcotic use 10/26/2014  . Pregnant 10/26/2014  . Scoliosis     Patient Active Problem List   Diagnosis Date Noted  . Post-dates pregnancy 04/16/2015  . 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 H/O macrosomia in infant in prior pregnancy, currently pregnant 01/18/2015  . History of preterm delivery, currently pregnant 01/18/2015  . Rh negative state in antepartum period 11/04/2014  . Cocaine use complicating pregnancy 11/04/2014  . Marijuana use 11/04/2014  . Supervision of other high-risk pregnancy 11/03/2014  . Smoker 11/03/2014  . Chronic back pain 10/26/2014  . History of narcotic use 10/26/2014  . 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 BUCKET HANDLE  TEAR OF LATERAL MENISCUS 09/27/2009  . KNEE JOINT INSTABILITY 09/01/2009    Past Surgical History:  Procedure Laterality Date  . KNEE SURGERY    . leg surgery from trauma    . TUBAL LIGATION Bilateral 04/17/2015   Procedure: POST PARTUM TUBAL LIGATION;  Surgeon: Adam Phenix, MD;  Location: [facility];  Service: Gynecology;  Laterality: Bilateral;     OB History    Gravida  4   Para  4   Term  3   Preterm  1   AB      Living  2     SAB      TAB      Ectopic      Multiple  0   Live Births  2        Obstetric Comments  No GDM        Family History  Problem Relation Age of Onset  . Diabetes Brother   . COPD Maternal Grandmother   . Arthritis Maternal Grandfather     Social History   Tobacco Use  . Smoking status: Current Every Day Smoker    Packs/day: 1.00    Years: 4.00    Pack years: 4.00    Types: Cigarettes  . Smokeless tobacco: Never Used  Substance Use Topics  . Alcohol use: No  . Drug use: No    Types: Marijuana    Comment: last used percocet 6/17, last used marijuana with positive pregnancy test     Home Medications Prior to Admission medications   Medication Sig Start Date  End Date Taking? Authorizing Provider  bacitracin ointment Apply 1 application topically 2 (two) times daily. 07/10/19   Henderly, Britni A, PA-C  Omega-3 Fatty Acids (FISH OIL) 1000 MG CAPS Take 1 capsule by mouth daily.    [provider]  ondansetron (ZOFRAN ODT) 4 MG disintegrating tablet Take 1 tablet (4 mg total) by mouth every 8 (eight) hours as needed for nausea or vomiting. 07/10/19   Henderly, Britni A, PA-C  SUBOXONE 8-2 MG FILM Place 1 Film under the tongue 2 (two) times daily. 04/02/17   [provider]    Allergies    Penicillins  Review of Systems   Review of Systems  Gastrointestinal: Positive for abdominal pain.  All other systems reviewed and are negative.   Physical Exam Updated Vital Signs BP (!) 116/59   Pulse 75   Temp  98.4 F (36.9 C)   Resp 18   Ht 5\' 7"  (1.702 m)   Wt 54.4 kg   LMP 10/23/2019   SpO2 100%   BMI 18.79 kg/m   Physical Exam Vitals and nursing note reviewed.  Constitutional:      General: She is not in acute distress.    Appearance: She is well-developed.  HENT:     Head: Atraumatic.  Eyes:     Conjunctiva/sclera: Conjunctivae normal.  Cardiovascular:     Rate and Rhythm: Normal rate and regular rhythm.  Pulmonary:     Effort: Pulmonary effort is normal.     Breath sounds: Normal breath sounds.  Abdominal:     General: Abdomen is flat.     Palpations: Abdomen is soft.     Tenderness: There is abdominal tenderness in the right lower quadrant and periumbilical area. There is no guarding or rebound. Negative signs include Murphy's sign, Rovsing's sign and McBurney's sign.  Genitourinary:    Comments: Chaperone present during exam.  No inguinal lymphadenopathy or inguinal hernia noted.  Normal external genitalia.  Mild discomfort with speculum insertion.  Small amount of dark blood noted in vaginal vault.  Cervical os is closed.  On bimanual examination right adnexal tenderness without cervical motion tenderness. Musculoskeletal:     Cervical back: Neck supple.  Skin:    Findings: No rash.  Neurological:     Mental Status: She is alert.     ED Results / Procedures / Treatments   Labs (all labs ordered are listed, but only abnormal results are displayed) Labs Reviewed  WET PREP, GENITAL - Abnormal; Notable for the following components:      Result Value   Clue Cells Wet Prep HPF POC PRESENT (*)    WBC, Wet Prep HPF POC MANY (*)    All other components within normal limits  CBC WITH DIFFERENTIAL/PLATELET - Abnormal; Notable for the following components:   RBC 3.86 (*)    Platelets 120 (*)    All other components within normal limits  COMPREHENSIVE METABOLIC PANEL - Abnormal; Notable for the following components:   Potassium 3.4 (*)    Glucose, Bld 67 (*)    All other  components within normal limits  URINALYSIS, ROUTINE W REFLEX MICROSCOPIC - Abnormal; Notable for the following components:   Color, Urine STRAW (*)    Leukocytes,Ua SMALL (*)    Bacteria, UA RARE (*)    All other components within normal limits  LIPASE, BLOOD  RAPID HIV SCREEN (HIV 1/2 AB+AG)  RPR  HIV ANTIBODY (ROUTINE TESTING W REFLEX)  GC/CHLAMYDIA PROBE AMP ([place]) NOT AT [facility]  EKG None  Radiology CT ABDOMEN PELVIS W CONTRAST  Result Date: 11/10/2019 CLINICAL DATA:  Right lower quadrant pain EXAM: CT ABDOMEN AND PELVIS WITH CONTRAST TECHNIQUE: Multidetector CT imaging of the abdomen and pelvis was performed using the standard protocol following bolus administration of intravenous contrast. CONTRAST:  OMNIPAQUE IOHEXOL 300 MG/ML  SOLN COMPARISON:  None. FINDINGS: Lower chest: No acute abnormality. Hepatobiliary: No focal hepatic abnormality. No calcified gallstones. No biliary dilatation. Pancreas: Unremarkable. No pancreatic ductal dilatation or surrounding inflammatory changes. Spleen: Normal in size without focal abnormality. Adrenals/Urinary Tract: Adrenal glands are unremarkable. Kidneys are normal, without renal calculi, focal lesion, or hydronephrosis. Bladder is unremarkable. Stomach/Bowel: Stomach is within normal limits. Appendix appears normal. No evidence of bowel wall thickening, distention, or inflammatory changes. Vascular/Lymphatic: No significant vascular findings are present. No enlarged abdominal or pelvic lymph nodes. Reproductive: Uterus is unremarkable. Ring-enhancing structure in the left adnexa, best seen on coronal views suggestive of an involuting cyst. Other: No free air. Small amount of slightly dense fluid within the posterior pelvis. Small amount of perihepatic ascites. Musculoskeletal: No acute or significant osseous findings. IMPRESSION: 1. Negative for acute appendicitis. 2. 3 cm rim enhancing structure in the left adnexa, probably representing an  involuting cyst. Small moderate fluid in the pelvis and left adnexa with slight increased density values suggesting possible hemorrhagic fluid. Small amount of perihepatic free fluid as well. Electronically Signed   By: Jasmine Pang M.D.   On: 11/10/2019 23:52    Procedures Procedures (including critical care time)  Medications Ordered in ED Medications  morphine 4 MG/ML injection 4 mg (has no administration in time range)  morphine 4 MG/ML injection 4 mg (4 mg Intravenous Given 11/10/19 2227)  ondansetron (ZOFRAN) injection 4 mg (4 mg Intravenous Given 11/10/19 2227)  sodium chloride 0.9 % bolus 1,000 mL (0 mLs Intravenous Stopped 11/10/19 2326)  iohexol (OMNIPAQUE) 300 MG/ML solution 100 mL (100 mLs Intravenous Contrast Given 11/10/19 2333)    ED Course  I have reviewed the triage vital signs and the nursing notes.  Pertinent labs & imaging results that were available during my care of the patient were reviewed by me and considered in my medical decision making (see chart for details).    MDM Rules/Calculators/A&P                      BP (!) 116/59   Pulse 75   Temp 98.4 F (36.9 C)   Resp 18   Ht 5\' 7"  (1.702 m)   Wt 54.4 kg   LMP 10/23/2019   SpO2 100%   BMI 18.79 kg/m   Final Clinical Impression(s) / ED Diagnoses Final diagnoses:  Ruptured ovarian cyst  BV (bacterial vaginosis)    Rx / DC Orders ED Discharge Orders         Ordered    ibuprofen (ADVIL) 600 MG tablet  Every 6 hours PRN     11/11/19 0006    metroNIDAZOLE (FLAGYL) 500 MG tablet  2 times daily     11/11/19 0006    doxycycline (VIBRAMYCIN) 100 MG capsule  2 times daily     11/11/19 0006         9:40 PM Patient here with pain to the right lower quadrant suggestive of potential appendicitis.  She also complained of vaginal bleeding will perform pelvic examination.  Anticipate abdominal pelvic CT scan for further evaluation.  10:09 PM Patient does have some right adnexal tenderness on pelvic  examination however I still think is appropriate to obtain abdominal pelvis CT scan to rule out appendicitis given her presentation.  11:30 PM Wet prep showing presence of Clue cells and many WBC.  CT of the abdomen pelvis are negative for acute appendicitis.  There is a 3 cm rim-enhancing structure in the left adnexa probably representing an involuting cyst.  Small moderate fluid in the pelvis and left adnexa are slightly increased density suggesting possible hemorrhagic fluid.  I suspect patient's pain is likely from a ruptured ovarian cyst.  I have low suspicion for ovarian torsion or tubo-ovarian abscess.  Patient discharged home with Flagyl, and anti-inflammatory medication.  Return precaution discussed.  Doxycycline prescribed for potential cervicitis.     Domenic Moras, PA-C 11/11/19 Sheila Oats    Noemi Chapel, MD 11/11/19 [phone]

## 2019-11-10 NOTE — ED Triage Notes (Signed)
Pt c/o right sided upper abd pain since Sat. Pt also c/o nausea and diarrhea. Pt states the pain is worse when she sits and walks. Pt also c/o body aches.

## 2019-11-11 LAB — HIV ANTIBODY (ROUTINE TESTING W REFLEX): HIV Screen 4th Generation wRfx: NONREACTIVE

## 2019-11-11 LAB — RAPID HIV SCREEN (HIV 1/2 AB+AG)
HIV 1/2 Antibodies: NONREACTIVE
HIV-1 P24 Antigen - HIV24: NONREACTIVE

## 2019-11-11 LAB — RPR: RPR Ser Ql: NONREACTIVE

## 2019-11-11 MED ORDER — METRONIDAZOLE 500 MG PO TABS: 500.0000 mg | ORAL_TABLET | Freq: Two times a day (BID) | ORAL | 0 refills | Status: DC

## 2019-11-11 MED ORDER — IBUPROFEN 600 MG PO TABS: 600.0000 mg | ORAL_TABLET | Freq: Four times a day (QID) | ORAL | 0 refills | Status: DC | PRN

## 2019-11-11 MED ORDER — DOXYCYCLINE HYCLATE 100 MG PO CAPS: 100.0000 mg | ORAL_CAPSULE | Freq: Two times a day (BID) | ORAL | 0 refills | Status: DC

## 2019-11-11 NOTE — Discharge Instructions (Signed)
Take medication prescribed as treatment for ruptured ovarian cyst.  Take antibiotics as well but avoid alcohol use as it will make you sick.  Return if you have any concerns.

## 2019-11-12 LAB — GC/CHLAMYDIA PROBE AMP (~~LOC~~) NOT AT ARMC
Chlamydia: NEGATIVE
Neisseria Gonorrhea: NEGATIVE

## 2020-04-15 ENCOUNTER — Other Ambulatory Visit: Payer: Self-pay

## 2020-04-15 ENCOUNTER — Emergency Department (HOSPITAL_COMMUNITY)
Admission: EM | Admit: 2020-04-15 | Discharge: 2020-04-15 | Disposition: A | Discharge status: Home / Self Care | Payer: Medicaid Other | Attending: Emergency Medicine | Admitting: Emergency Medicine

## 2020-04-15 ENCOUNTER — Encounter (HOSPITAL_COMMUNITY): Payer: Self-pay | Admitting: Emergency Medicine

## 2020-04-15 DIAGNOSIS — Z202 Contact with and (suspected) exposure to infections with a predominantly sexual mode of transmission: Secondary | ICD-10-CM | POA: Insufficient documentation

## 2020-04-15 DIAGNOSIS — B9689 Other specified bacterial agents as the cause of diseases classified elsewhere: Secondary | ICD-10-CM

## 2020-04-15 DIAGNOSIS — N760 Acute vaginitis: Secondary | ICD-10-CM | POA: Diagnosis not present

## 2020-04-15 DIAGNOSIS — Z3202 Encounter for pregnancy test, result negative: Secondary | ICD-10-CM | POA: Diagnosis not present

## 2020-04-15 DIAGNOSIS — F17210 Nicotine dependence, cigarettes, uncomplicated: Secondary | ICD-10-CM | POA: Insufficient documentation

## 2020-04-15 DIAGNOSIS — N898 Other specified noninflammatory disorders of vagina: Secondary | ICD-10-CM | POA: Diagnosis present

## 2020-04-15 LAB — PREGNANCY, URINE: Preg Test, Ur: NEGATIVE

## 2020-04-15 LAB — WET PREP, GENITAL
Sperm: NONE SEEN
Trich, Wet Prep: NONE SEEN
Yeast Wet Prep HPF POC: NONE SEEN

## 2020-04-15 MED ORDER — METRONIDAZOLE 500 MG PO TABS
500.0000 mg | ORAL_TABLET | Freq: Two times a day (BID) | ORAL | 0 refills | Status: DC
Start: 2020-04-15 — End: 2021-11-23

## 2020-04-15 MED ORDER — GENTAMICIN SULFATE 40 MG/ML IJ SOLN
240.0000 mg | Freq: Once | INTRAMUSCULAR | Status: AC
  Administered 2020-04-15: 240 mg via INTRAMUSCULAR
  Filled 2020-04-15: qty 6

## 2020-04-15 MED ORDER — AZITHROMYCIN 250 MG PO TABS
2000.0000 mg | ORAL_TABLET | Freq: Once | ORAL | Status: AC
  Administered 2020-04-15: 2000 mg via ORAL
  Filled 2020-04-15: qty 8

## 2020-04-15 MED ORDER — METRONIDAZOLE 500 MG PO TABS
500.0000 mg | ORAL_TABLET | Freq: Once | ORAL | Status: AC
  Administered 2020-04-15: 500 mg via ORAL
  Filled 2020-04-15: qty 1

## 2020-04-15 NOTE — ED Triage Notes (Signed)
Pt presents to ER wanting to be checked for STD. Denies abnormal vaginal discharge or dysuria. Denies pain.

## 2020-04-15 NOTE — ED Notes (Signed)
Pt reports her boyfriend cheated on her   Here because she wants to be checked that he didn't give her anything

## 2020-04-15 NOTE — ED Notes (Signed)
Pt declines repeat VS saying "but thank you though"

## 2020-04-15 NOTE — Discharge Instructions (Signed)
You have been treated tonight for possible gonorrhea and chlamydia, although these cultures will not be back for about 2 days.  You do have bacterial vaginosis which is causing your discharge.  Take the medicine prescribed for this infection.  Avoid intercourse until your symptoms have completely resolved, or one week, whichever is longer.  Your partner will need to be treated if your gonorrhea or chlamydia cultures are positive.

## 2020-04-16 LAB — HIV ANTIBODY (ROUTINE TESTING W REFLEX): HIV Screen 4th Generation wRfx: NONREACTIVE

## 2020-04-16 LAB — RPR: RPR Ser Ql: NONREACTIVE

## 2020-04-19 LAB — GC/CHLAMYDIA PROBE AMP (~~LOC~~) NOT AT ARMC
Chlamydia: NEGATIVE
Comment: NEGATIVE
Comment: NORMAL
Neisseria Gonorrhea: NEGATIVE

## 2020-04-20 NOTE — ED Provider Notes (Signed)
[facility] EMERGENCY DEPARTMENT Provider Note   CSN: [id number] Arrival date & time: 04/15/20  1843     History Chief Complaint  Patient presents with  . Exposure to STD    Laurie Norris is a 29 y.o. female with no significant past medical history presenting for evaluation of possible STD exposure. She denies any symptoms including no abdominal pain but has noted some thin watery vaginal discharge along with vaginal irritation and itching for the past several days, no fevers or chills, nausea or vomiting. She recently discovered her boyfriend had sex with another female and she was told by "friends" that this person possibly has STDs.  HPI     Past Medical History:  Diagnosis Date  . Chronic back pain 10/26/2014  . History of narcotic use 10/26/2014  . Pregnant 10/26/2014  . Scoliosis     Patient Active Problem List   Diagnosis Date Noted  . Post-dates pregnancy 04/16/2015  . 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 H/O macrosomia in infant in prior pregnancy, currently pregnant 01/18/2015  . History of preterm delivery, currently pregnant 01/18/2015  . Rh negative state in antepartum period 11/04/2014  . Cocaine use complicating pregnancy 11/04/2014  . Marijuana use 11/04/2014  . Supervision of other high-risk pregnancy 11/03/2014  . Smoker 11/03/2014  . Chronic back pain 10/26/2014  . History of narcotic use 10/26/2014  . 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 BUCKET HANDLE TEAR OF LATERAL MENISCUS 09/27/2009  . KNEE JOINT INSTABILITY 09/01/2009    Past Surgical History:  Procedure Laterality Date  . KNEE SURGERY    . leg surgery from trauma    . TUBAL LIGATION Bilateral 04/17/2015   Procedure: POST PARTUM TUBAL LIGATION;  Surgeon: Adam Phenix, MD;  Location: [facility];  Service: Gynecology;  Laterality: Bilateral;     OB History    Gravida  4   Para  4   Term  3   Preterm  1   AB      Living  2     SAB      TAB      Ectopic      Multiple    0   Live Births  2        Obstetric Comments  No GDM        Family History  Problem Relation Age of Onset  . Diabetes Brother   . COPD Maternal Grandmother   . Arthritis Maternal Grandfather     Social History   Tobacco Use  . Smoking status: Current Every Day Smoker    Packs/day: 1.00    Years: 4.00    Pack years: 4.00    Types: Cigarettes  . Smokeless tobacco: Never Used  Substance Use Topics  . Alcohol use: No  . Drug use: No    Types: Marijuana    Comment: last used percocet 6/17, last used marijuana with positive pregnancy test     Home Medications Prior to Admission medications   Medication Sig Start Date End Date Taking? Authorizing Provider  doxycycline (VIBRAMYCIN) 100 MG capsule Take 1 capsule (100 mg total) by mouth 2 (two) times daily. One po bid x 7 days 11/11/19   Fayrene Helper, PA-C  ibuprofen (ADVIL) 600 MG tablet Take 1 tablet (600 mg total) by mouth every 6 (six) hours as needed. 11/11/19   Fayrene Helper, PA-C  metroNIDAZOLE (FLAGYL) 500 MG tablet Take 1 tablet (500 mg total) by mouth 2 (two) times daily. 04/15/20  Burgess Amor, PA-C  Multiple Vitamin (MULTIVITAMIN WITH MINERALS) TABS tablet Take 1 tablet by mouth daily.    [provider]  Omega-3 Fatty Acids (FISH OIL) 1000 MG CAPS Take 1 capsule by mouth daily.    [provider]    Allergies    Penicillins  Review of Systems   Review of Systems  Constitutional: Negative for fever.  HENT: Negative for congestion and sore throat.   Eyes: Negative.   Respiratory: Negative for chest tightness and shortness of breath.   Cardiovascular: Negative for chest pain.  Gastrointestinal: Negative for abdominal pain, nausea and vomiting.  Genitourinary: Positive for vaginal discharge. Negative for dysuria, flank pain, frequency, menstrual problem, pelvic pain and vaginal pain.  Musculoskeletal: Negative for arthralgias, joint swelling and neck pain.  Skin: Negative.  Negative for rash and  wound.  Neurological: Negative for dizziness, weakness, light-headedness, numbness and headaches.  Psychiatric/Behavioral: Negative.     Physical Exam Updated Vital Signs BP 110/75 (BP Location: Right Arm)   Pulse 90   Temp 98.7 F (37.1 C) (Oral)   Resp 18   LMP 03/23/2020   SpO2 100%   Physical Exam Vitals and nursing note reviewed. Exam conducted with a chaperone present.  Constitutional:      Appearance: She is well-developed.  HENT:     Head: Normocephalic and atraumatic.  Eyes:     Conjunctiva/sclera: Conjunctivae normal.  Cardiovascular:     Rate and Rhythm: Normal rate and regular rhythm.     Heart sounds: Normal heart sounds.  Pulmonary:     Effort: Pulmonary effort is normal.     Breath sounds: Normal breath sounds. No wheezing.  Abdominal:     General: Bowel sounds are normal. There is no distension.     Palpations: Abdomen is soft.     Tenderness: There is no abdominal tenderness. There is no guarding.  Genitourinary:    Vagina: Vaginal discharge present.     Cervix: No cervical motion tenderness, friability or erythema.     Uterus: Normal. Not tender.      Adnexa: Right adnexa normal and left adnexa normal.  Musculoskeletal:        General: Normal range of motion.     Cervical back: Normal range of motion.  Skin:    General: Skin is warm and dry.  Neurological:     Mental Status: She is alert.     ED Results / Procedures / Treatments   Labs (all labs ordered are listed, but only abnormal results are displayed) Labs Reviewed  WET PREP, GENITAL - Abnormal; Notable for the following components:      Result Value   Clue Cells Wet Prep HPF POC PRESENT (*)    WBC, Wet Prep HPF POC MANY (*)    All other components within normal limits  HIV ANTIBODY (ROUTINE TESTING W REFLEX)  RPR  PREGNANCY, URINE  GC/CHLAMYDIA PROBE AMP ([place]) NOT AT [facility]    EKG None  Radiology No results found.  Procedures Procedures (including critical care  time)  Medications Ordered in ED Medications  gentamicin (GARAMYCIN) injection 240 mg (240 mg Intramuscular Given 04/15/20 2205)  azithromycin (ZITHROMAX) tablet 2,000 mg (2,000 mg Oral Given 04/15/20 2155)  metroNIDAZOLE (FLAGYL) tablet 500 mg (500 mg Oral Given 04/15/20 2241)    ED Course  I have reviewed the triage vital signs and the nursing notes.  Pertinent labs & imaging results that were available during my care of the patient were reviewed by  me and considered in my medical decision making (see chart for details).    MDM Rules/Calculators/A&P                          Patient with possible STD exposure, she is positive for bacterial vaginosis. Discussed covering her for STDs today versus awaiting cultures, patient would like to be treated. She was given gentamicin IM and Zithromax given penicillin allergy. She was also placed on Flagyl for her bacterial vaginosis. Discussed safer sex practices. Plan follow-up for any worsening or persistent symptoms. She should avoid intercourse for the next week or longer if her symptoms persist. Final Clinical Impression(s) / ED Diagnoses Final diagnoses:  STD exposure  Bacterial vaginosis    Rx / DC Orders ED Discharge Orders         Ordered    metroNIDAZOLE (FLAGYL) 500 MG tablet  2 times daily     Discontinue  Reprint     04/15/20 2231           Burgess Amor, PA-C 04/20/20 1936    Eber Hong, MD 04/29/20 [phone]

## 2021-05-04 IMAGING — CT CT ABD-PELV W/ CM
2 of 4 series · 16 of 46 positions shown, 18 images · IV contrast (Omnipaque or Isovue)
Comparison: None.

CLINICAL DATA: Right lower quadrant pain

EXAM:
CT ABDOMEN AND PELVIS WITH CONTRAST
TECHNIQUE: Multidetector CT imaging of the abdomen and pelvis was performed
using the standard protocol following bolus administration of
intravenous contrast.
CONTRAST:  100mL OMNIPAQUE IOHEXOL 300 MG/ML  SOLN

[Series 2: axial st · axial · 0.67mm/px · z∈[-429,-89]mm · 13 of 80 slices shown, 15 images]
[im 6/80  soft-tissue]
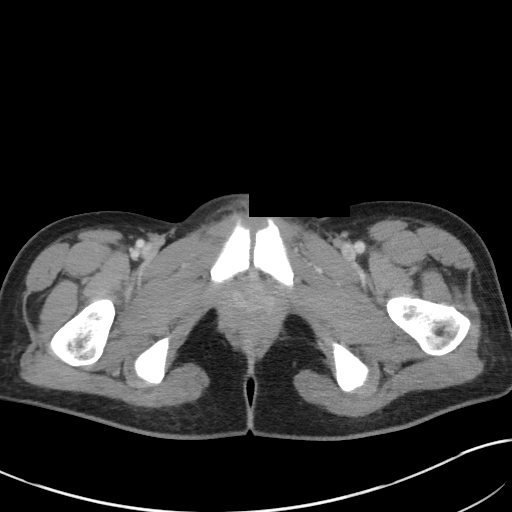
[im 6/80  bone]
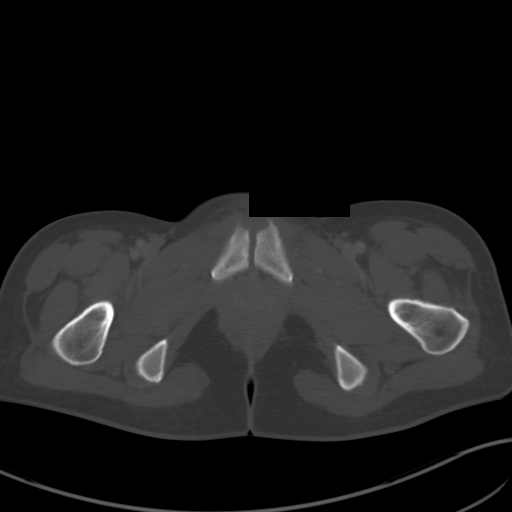
[im 11/80  soft-tissue]
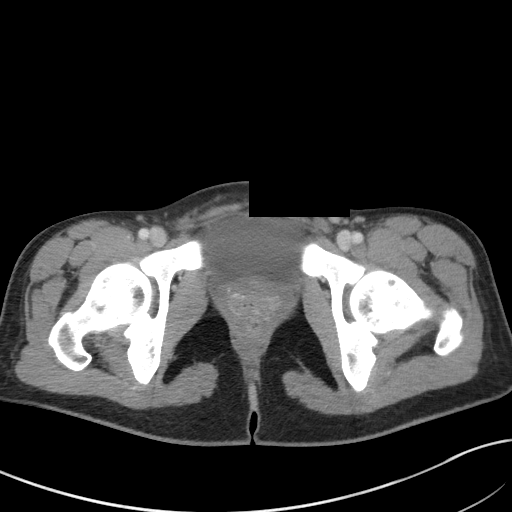
[im 16/80  soft-tissue]
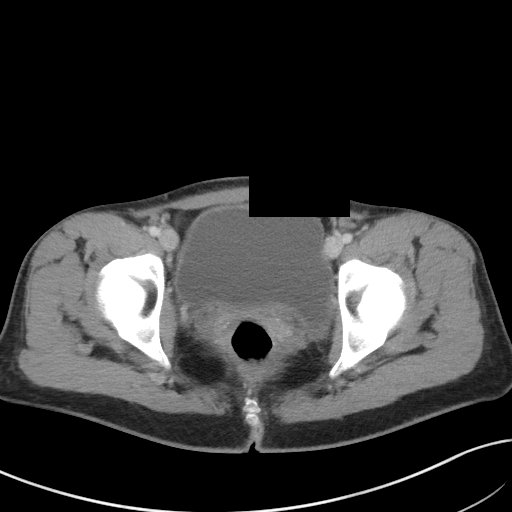
[im 22/80  soft-tissue]
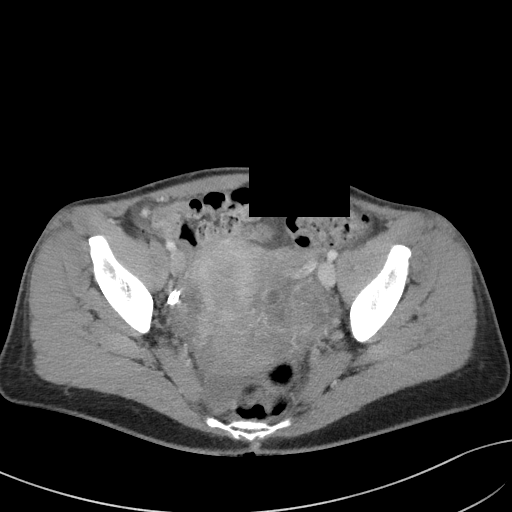
[im 27/80  soft-tissue]
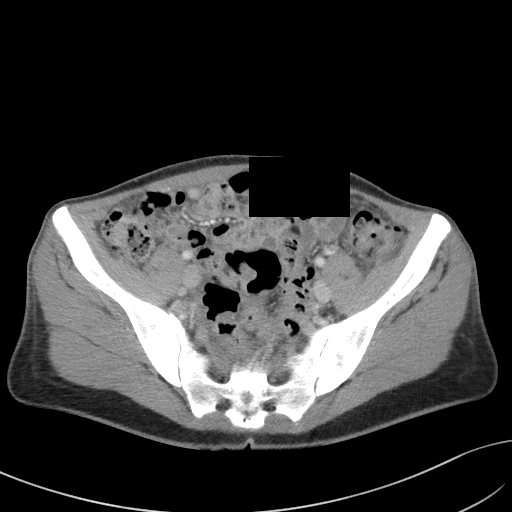
[im 32/80  soft-tissue]
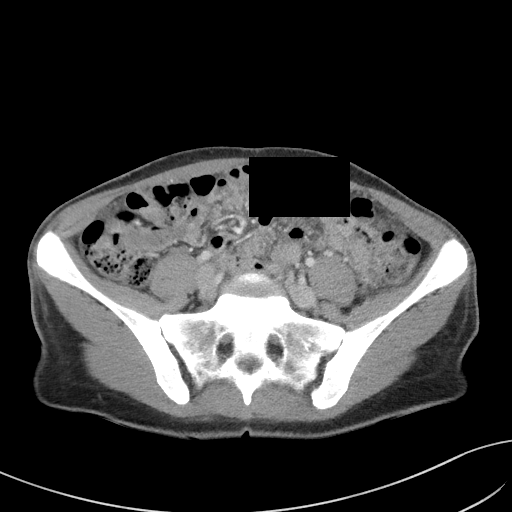
[im 43/80  soft-tissue]
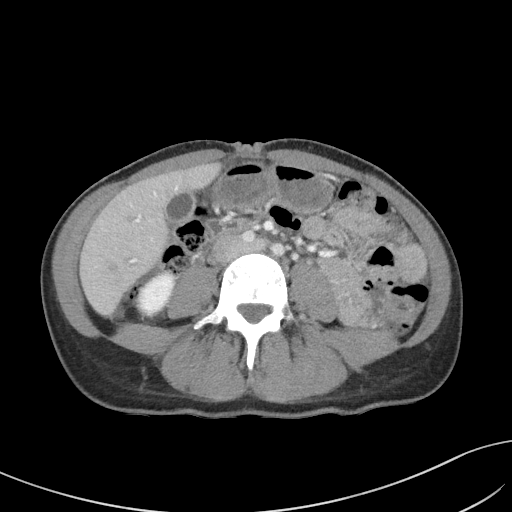
[im 48/80  soft-tissue]
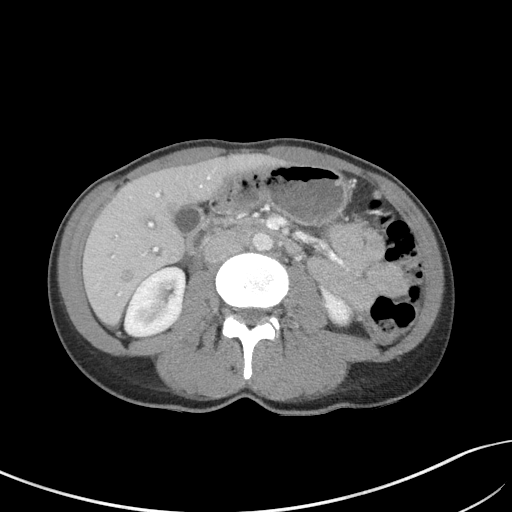
[im 53/80  soft-tissue]
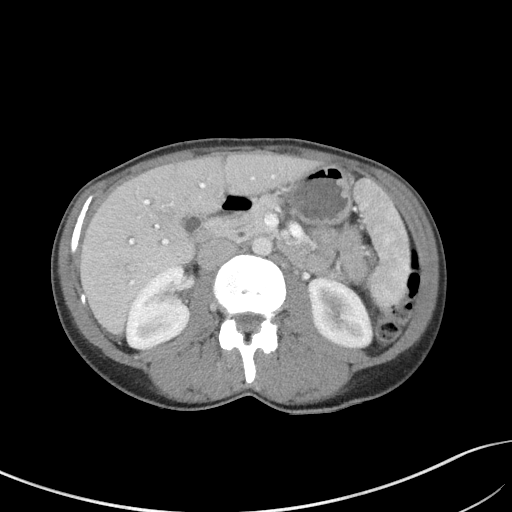
[im 53/80  bone]
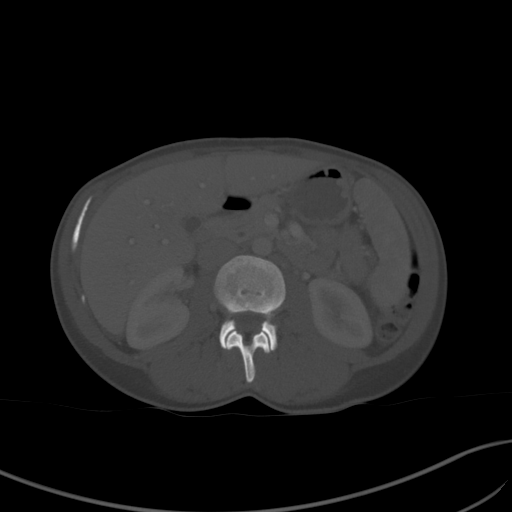
[im 58/80  soft-tissue]
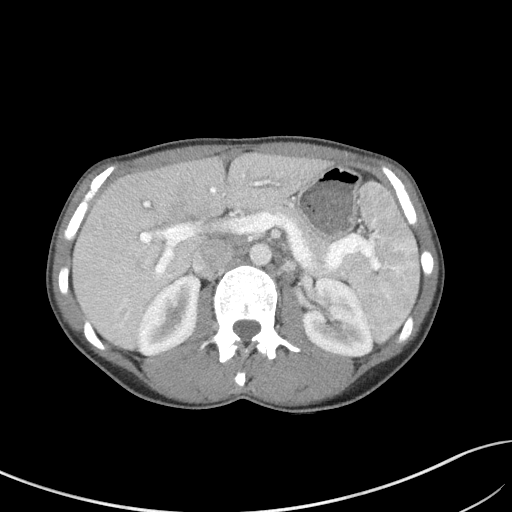
[im 64/80  soft-tissue]
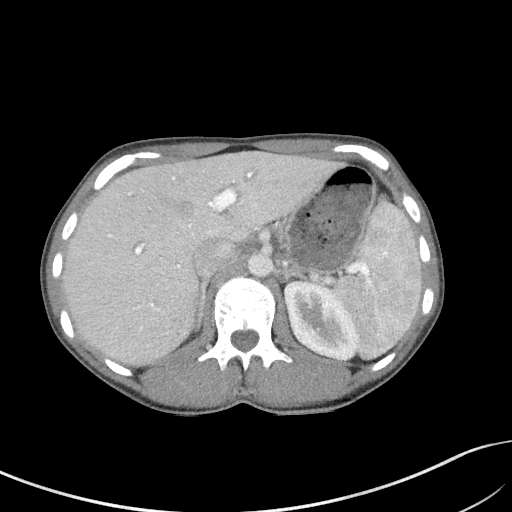
[im 69/80  soft-tissue]
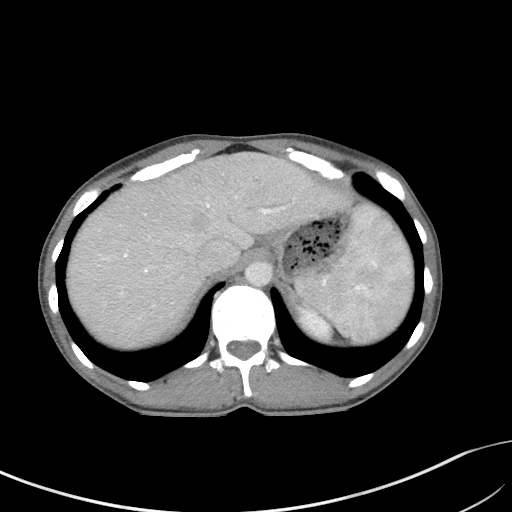
[im 74/80  soft-tissue]
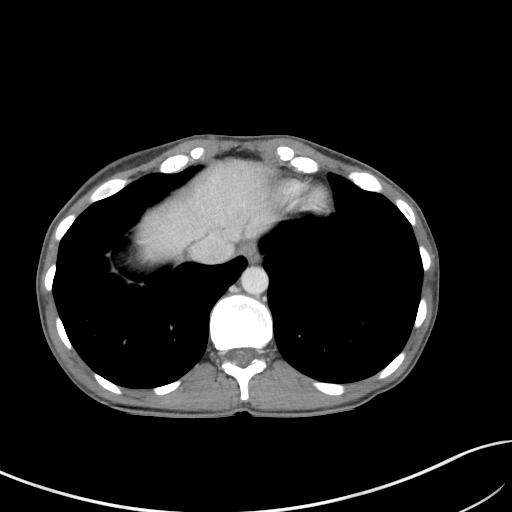

[Series 5: coronal st · coronal · 0.70mm/px · 3 of 71 slices shown]
[im 24/71  soft-tissue]
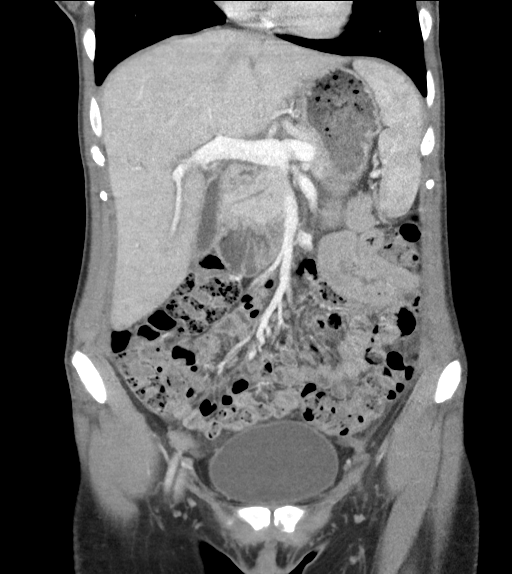
[im 32/71  soft-tissue]
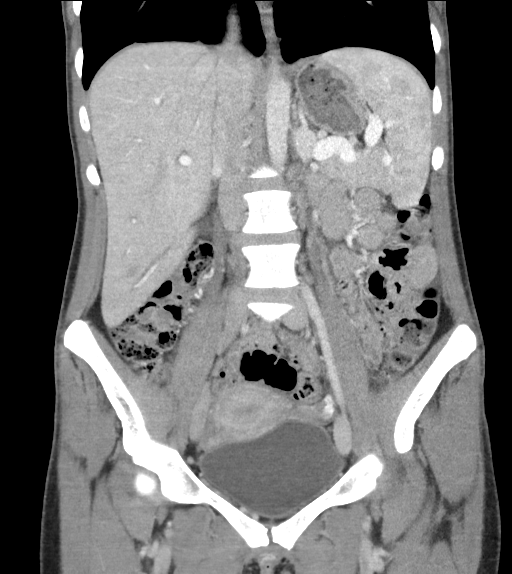
[im 39/71  soft-tissue]
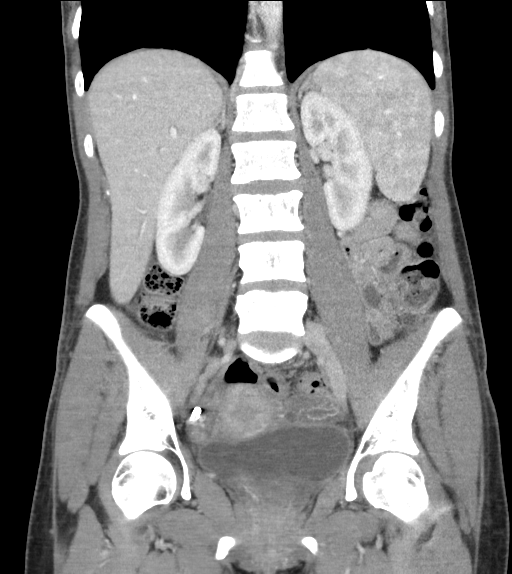

[16 of 46 positions shown; findings below may reference images not displayed]

FINDINGS: Lower chest: No acute abnormality.

Hepatobiliary: No focal hepatic abnormality. No calcified
gallstones. No biliary dilatation.

Pancreas: Unremarkable. No pancreatic ductal dilatation or
surrounding inflammatory changes.

Spleen: Normal in size without focal abnormality.

Adrenals/Urinary Tract: Adrenal glands are unremarkable. Kidneys are
normal, without renal calculi, focal lesion, or hydronephrosis.
Bladder is unremarkable.

Stomach/Bowel: Stomach is within normal limits. Appendix appears
normal. No evidence of bowel wall thickening, distention, or
inflammatory changes.

Vascular/Lymphatic: No significant vascular findings are present. No
enlarged abdominal or pelvic lymph nodes.

Reproductive: Uterus is unremarkable. Ring-enhancing structure in
the left adnexa, best seen on coronal views suggestive of an
involuting cyst.

Other: No free air. Small amount of slightly dense fluid within the
posterior pelvis. Small amount of perihepatic ascites.

Musculoskeletal: No acute or significant osseous findings.
IMPRESSION: 1. Negative for acute appendicitis.
2. 3 cm rim enhancing structure in the left adnexa, probably
representing an involuting cyst. Small moderate fluid in the pelvis
and left adnexa with slight increased density values suggesting
possible hemorrhagic fluid. Small amount of perihepatic free fluid
as well.

## 2021-08-26 ENCOUNTER — Telehealth: Payer: Medicaid Other | Admitting: Nurse Practitioner

## 2021-08-26 ENCOUNTER — Encounter: Payer: Self-pay | Admitting: Nurse Practitioner

## 2021-08-26 DIAGNOSIS — K047 Periapical abscess without sinus: Secondary | ICD-10-CM

## 2021-08-26 MED ORDER — CLINDAMYCIN HCL 300 MG PO CAPS: 300.0000 mg | ORAL_CAPSULE | Freq: Four times a day (QID) | ORAL | 0 refills | Status: DC

## 2021-08-26 MED ORDER — NAPROXEN 500 MG PO TABS: 500.0000 mg | ORAL_TABLET | Freq: Two times a day (BID) | ORAL | 0 refills | Status: DC

## 2021-08-26 NOTE — Progress Notes (Signed)
Virtual Visit Consent   Laurie Norris, you are scheduled for a virtual visit with Laurie Norris, [place], a [facility] provider, today.     Just as with appointments in the office, your consent must be obtained to participate.  Your consent will be active for this visit and any virtual visit you may have with one of our providers in the next 365 days.     If you have a MyChart account, a copy of this consent can be sent to you electronically.  All virtual visits are billed to your insurance company just like a traditional visit in the office.    As this is a virtual visit, video technology does not allow for your provider to perform a traditional examination.  This may limit your provider's ability to fully assess your condition.  If your provider identifies any concerns that need to be evaluated in person or the need to arrange testing (such as labs, EKG, etc.), we will make arrangements to do so.     Although advances in technology are sophisticated, we cannot ensure that it will always work on either your end or our end.  If the connection with a video visit is poor, the visit may have to be switched to a telephone visit.  With either a video or telephone visit, we are not always able to ensure that we have a secure connection.     I need to obtain your verbal consent now.   Are you willing to proceed with your visit today? YES   PRAKRITI Norris has provided verbal consent on 08/26/2021 for a virtual visit (video or telephone).   Laurie Hassell Done, FNP   Date: 08/26/2021 3:26 PM   Virtual Visit via Video Note   I, Laurie Norris, connected with Laurie Norris ([id number], Jul 15, 1991) on 08/26/21 at  4:30 PM EDT by a video-enabled telemedicine application and verified that I am speaking with the correct person using two identifiers.  Location: Patient: Virtual Visit Location Patient: Home Provider: Virtual Visit Location Provider: Mobile   I discussed the  limitations of evaluation and management by telemedicine and the availability of in person appointments. The patient expressed understanding and agreed to proceed.    History of Present Illness: Laurie Norris is a 30 y.o. who identifies as a female who was assigned female at birth, and is being seen today for abscess tooth.  HPI: Patient states that she has had a tooth ache for 2 days. Is getting worse and now jaw is swollen. She has been taking motrin for pain which helps a little. She has been calling dentist but has not been able to get an appointment.   Problems:  Patient Active Problem List   Diagnosis Date Noted   Post-dates pregnancy 04/16/2015   Drug dependence during pregnancy ([place]) 01/20/2015   H/O macrosomia in infant in prior pregnancy, currently pregnant 01/18/2015   History of preterm delivery, currently pregnant 01/18/2015   Rh negative state in antepartum period 11/04/2014   Cocaine use complicating pregnancy [id number]   Marijuana use 11/04/2014   Supervision of other high-risk pregnancy 11/03/2014   Smoker 11/03/2014   Chronic back pain 10/26/2014   History of narcotic use 10/26/2014   Mononeuritis leg 03/29/2011   BUCKET HANDLE TEAR OF LATERAL MENISCUS 09/27/2009   KNEE JOINT INSTABILITY 09/01/2009    Allergies:  Allergies  Allergen Reactions   Penicillins Anaphylaxis and Swelling    Has patient had a PCN reaction causing immediate rash,  facial/tongue/throat swelling, SOB or lightheadedness with hypotension: Yes Has patient had a PCN reaction causing severe rash involving mucus membranes or skin necrosis: Yes Has patient had a PCN reaction that required hospitalization: Yes Has patient had a PCN reaction occurring within the last 10 years: No If all of the above answers are "NO", then may proceed with Cephalosporin use.    Medications:  Current Outpatient Medications:    doxycycline (VIBRAMYCIN) 100 MG capsule, Take 1 capsule (100 mg total) by mouth 2 (two)  times daily. One po bid x 7 days, Disp: 14 capsule, Rfl: 0   ibuprofen (ADVIL) 600 MG tablet, Take 1 tablet (600 mg total) by mouth every 6 (six) hours as needed., Disp: 30 tablet, Rfl: 0   metroNIDAZOLE (FLAGYL) 500 MG tablet, Take 1 tablet (500 mg total) by mouth 2 (two) times daily., Disp: 14 tablet, Rfl: 0   Multiple Vitamin (MULTIVITAMIN WITH MINERALS) TABS tablet, Take 1 tablet by mouth daily., Disp: , Rfl:    Omega-3 Fatty Acids (FISH OIL) 1000 MG CAPS, Take 1 capsule by mouth daily., Disp: , Rfl:   Observations/Objective: Patient is well-developed, well-nourished in no acute distress.  Resting comfortably  at home.  Head is normocephalic, atraumatic.  No labored breathing.  Speech is clear and coherent with logical content.  Patient is alert and oriented at baseline.  Right swollen jaw  Assessment and Plan:  Laurie Norris in today with chief complaint of No chief complaint on file.   1. Dental abscess Gargle with warm salt water See dentist as soon as possible Meds ordered this encounter  Medications   clindamycin (CLEOCIN) 300 MG capsule    Sig: Take 1 capsule (300 mg total) by mouth 4 (four) times daily.    Dispense:  40 capsule    Refill:  0    Order Specific Question:   Supervising Provider    Answer:   Laurie Norris [[id number]]   naproxen (NAPROSYN) 500 MG tablet    Sig: Take 1 tablet (500 mg total) by mouth 2 (two) times daily with a meal.    Dispense:  60 tablet    Refill:  0    Order Specific Question:   Supervising Provider    Answer:   Laurie Norris [[id number]]      Follow Up Instructions: I discussed the assessment and treatment plan with the patient. The patient was provided an opportunity to ask questions and all were answered. The patient agreed with the plan and demonstrated an understanding of the instructions.  A copy of instructions were sent to the patient via MyChart.  The patient was advised to call back or seek an in-person evaluation if the symptoms  worsen or if the condition fails to improve as anticipated.  Time:  I spent 10 minutes with the patient via telehealth technology discussing the above problems/concerns.    Laurie Daphine Deutscher, FNP

## 2021-08-26 NOTE — Patient Instructions (Signed)
Dental Abscess ?A dental abscess is an area of pus in or around a tooth. It comes from an infection. It can cause pain and other symptoms. Treatment will help with symptoms and prevent the infection from spreading. ?What are the causes? ?This condition is caused by an infection in or around the tooth. This can be from: ?Very bad tooth decay (cavities). ?A bad injury to the tooth, such as a broken or chipped tooth. ?What increases the risk? ?The risk to get an abscess is higher in males. It is also more likely in people who: ?Have dental decay. ?Have very bad gum disease. ?Eat sugary snacks between meals. ?Use tobacco. ?Have diabetes. ?Have a weak disease-fighting system (immune system). ?Do not brush their teeth regularly. ?What are the signs or symptoms? ?Some mild symptoms are: ?Tenderness. ?Bad breath. ?Fever. ?A sharp, sour taste in the mouth. ?Pain in and around the infected tooth. ?Worse symptoms of this condition include: ?Swollen neck glands. ?Chills. ?Pus draining around the tooth. ?Swelling and redness around the tooth, the mouth, or the face. ?Very bad pain in and around the tooth. ?The worst symptoms can include: ?Difficulty swallowing. ?Difficulty opening your mouth. ?Feeling like you may vomit or vomiting. ?How is this treated? ?This is treated by getting rid of the infection. Your dentist will discuss ways to do this, including: ?Antibiotic medicines. ?Antibacterial mouth rinse. ?An incision in the abscess to drain out the pus. ?A root canal. ?Removing the tooth. ?Follow these instructions at home: ?Medicines ?Take over-the-counter and prescription medicines only as told by your dentist. ?If you were prescribed an antibiotic medicine, take it as told by your dentist. Do not stop taking it even if you start to feel better. ?If you were prescribed a gel that has numbing medicine in it, use it exactly as told. ?Ask your dentist if you should avoid driving or using machines while you are taking your  medicine. ?General instructions ?Rinse your mouth often with salt water. To make salt water, dissolve ?-1 tsp (3-6 g) of salt in 1 cup (237 mL) of warm water. ?Eat a soft diet while your mouth is healing. ?Drink enough fluid to keep your pee (urine) pale yellow. ?Do not apply heat to the outside of your mouth. ?Do not smoke or use any products that contain nicotine or tobacco. If you need help quitting, ask your dentist. ?Keep all follow-up visits. ?Prevent an abscess ?Brush your teeth every morning and every night. Use fluoride toothpaste. ?Floss your teeth each day. ?Get dental cleanings as often as told by your dentist. ?Think about getting dental sealant put on teeth that have deep holes (decay). ?Drink water that has fluoride in it. ?Most tap water has fluoride. ?Check the label on bottled water to see if it has fluoride in it. ?Drink water instead of sugary drinks. ?Eat healthy meals and snacks. ?Wear a mouth guard or face shield when you play sports. ?Contact a doctor if: ?Your pain is worse and medicine does not help. ?Get help right away if: ?You have a fever or chills. ?Your symptoms suddenly get worse. ?You have a very bad headache. ?You have problems breathing or swallowing. ?You have trouble opening your mouth. ?You have swelling in your neck or close to your eye. ?These symptoms may be an emergency. Get help right away. Call your local emergency services (911 in the [place] ?Do not wait to see if the symptoms will go away. ?Do not drive yourself to the hospital. ?Summary ?A dental abscess is   an area of pus in or around a tooth. It is caused by an infection. ?Treatment will help with symptoms and prevent the infection from spreading. ?Take over-the-counter and prescription medicines only as told by your dentist. ?To prevent an abscess, take good care of your teeth. Brush your teeth every morning and night. Use floss every day. ?Get dental cleanings as often as told by your dentist. ?This information is  not intended to replace advice given to you by your health care provider. Make sure you discuss any questions you have with your health care provider. ?Document Revised: 12/16/2020 Document Reviewed: 12/16/2020 ?Elsevier Patient Education ? 2022 [employer] ? ?

## 2021-11-23 ENCOUNTER — Ambulatory Visit: Payer: Medicaid Other | Admitting: Orthopedic Surgery

## 2021-11-23 ENCOUNTER — Other Ambulatory Visit: Payer: Self-pay

## 2021-11-23 ENCOUNTER — Ambulatory Visit: Payer: Medicaid Other

## 2021-11-23 ENCOUNTER — Encounter: Payer: Self-pay | Admitting: Orthopedic Surgery

## 2021-11-23 VITALS — BP 130/88 | HR 81 | Ht 66.0 in | Wt 198.0 lb

## 2021-11-23 DIAGNOSIS — M25562 Pain in left knee: Secondary | ICD-10-CM | POA: Diagnosis not present

## 2021-11-23 NOTE — Patient Instructions (Signed)
Continue taking ibuprofen for pain - you can take this 3 times per day.  I recommend you take this medication every 6-8 hours for the next week or so, and then as needed  Work with PT  Ice the knee to help with swelling  Follow up in 6 weeks

## 2021-11-23 NOTE — Progress Notes (Signed)
New Patient Visit  Assessment: Laurie Norris is a 31 y.o. female with the following: 1. Acute pain of left knee  Plan: Patient continues to have pain in the left knee, after a fall and twisting injury approximately 1 week ago.  She has some swelling in the left knee, but this is improved.  Pain is primary in the posterior aspect of the knee.  I provided reassurance.  I recommended physical therapy at this time.  If her symptoms do not improve, we will likely discuss proceeding with an MRI.  We will see her in about 6 weeks.   Follow-up: Return in about 6 weeks (around 01/04/2022).  Subjective:  Chief Complaint  Patient presents with   Knee Pain    1 week ago fell down stairs, helping uncle carry groceries in.  Twisted left knee when fell, pain posteriorly.  Elevation, ibuprofen.      History of Present Illness: Laurie Norris is a 31 y.o. female who presents for evaluation of left knee pain.  She sustained a fall and twisted her left knee.  She had pain in the posterior aspect of the knee.  She says that the swelling was significant the next day.  Since then, she has some improvement in her swelling.  She is taking ibuprofen as needed.  Her activities have been restricted otherwise.  No prior injuries to her left knee.   Review of Systems: No fevers or chills No numbness or tingling No chest pain No shortness of breath No bowel or bladder dysfunction No GI distress No headaches   Medical History:  Past Medical History:  Diagnosis Date   Chronic back pain 10/26/2014   History of narcotic use 10/26/2014   Pregnant 10/26/2014   Scoliosis     Past Surgical History:  Procedure Laterality Date   KNEE SURGERY     leg surgery from trauma     TUBAL LIGATION Bilateral 04/17/2015   Procedure: POST PARTUM TUBAL LIGATION;  Surgeon: Woodroe Mode, MD;  Location: [facility];  Service: Gynecology;  Laterality: Bilateral;    Family History  Problem Relation Age of Onset   Diabetes  Brother    COPD Maternal Grandmother    Arthritis Maternal Grandfather    Social History   Tobacco Use   Smoking status: Every Day    Packs/day: 1.00    Years: 4.00    Pack years: 4.00    Types: Cigarettes   Smokeless tobacco: Never  Substance Use Topics   Alcohol use: No   Drug use: No    Types: Marijuana    Comment: last used percocet 6/17, last used marijuana with positive pregnancy test     Allergies  Allergen Reactions   Penicillins Anaphylaxis and Swelling    Has patient had a PCN reaction causing immediate rash, facial/tongue/throat swelling, SOB or lightheadedness with hypotension: Yes Has patient had a PCN reaction causing severe rash involving mucus membranes or skin necrosis: Yes Has patient had a PCN reaction that required hospitalization: Yes Has patient had a PCN reaction occurring within the last 10 years: No If all of the above answers are "NO", then may proceed with Cephalosporin use.    Naproxen Nausea Only    Intolerant to    Current Meds  Medication Sig   ibuprofen (ADVIL) 600 MG tablet Take 1 tablet (600 mg total) by mouth every 6 (six) hours as needed.    Objective: BP 130/88    Pulse 81    Ht  5\' 6"  (1.676 m)    Wt 198 lb (89.8 kg)    BMI 31.96 kg/m   Physical Exam:  General: Alert and oriented.  No acute distress. Gait: Left sided antalgic gait.  Evaluation left knee demonstrates a mild effusion.  She is able to achieve full extension.  She tolerates flexion beyond 90 degrees.  Some tenderness to palpation within the posterior aspect of the knee.  Negative Lachman.  No increased laxity varus valgus stress.  No visible bruising.  IMAGING: I personally ordered and reviewed the following images   X-rays the left knee were obtained in clinic today.  No acute injuries are noted.  Overall neutral alignment.  Well-maintained joint spaces.  Impression: Normal-appearing left knee x-rays.  New Medications:  No orders of the defined types were  placed in this encounter.     Mordecai Rasmussen, MD  11/23/2021 11:48 PM

## 2022-01-04 ENCOUNTER — Encounter: Payer: Self-pay | Admitting: Orthopedic Surgery

## 2022-01-04 ENCOUNTER — Ambulatory Visit: Payer: Medicaid Other | Admitting: Orthopedic Surgery

## 2022-02-03 ENCOUNTER — Ambulatory Visit: Admit: 2022-02-03 | Discharge: 2022-02-03 | Discharge status: Absent without leave | Payer: Medicaid Other

## 2022-02-04 ENCOUNTER — Ambulatory Visit
Admission: EM | Admit: 2022-02-04 | Discharge: 2022-02-04 | Disposition: A | Discharge status: Home / Self Care | Payer: Medicaid Other | Attending: Urgent Care | Admitting: Urgent Care

## 2022-02-04 VITALS — BP 127/87 | HR 96 | Temp 98.7°F | Resp 16

## 2022-02-04 DIAGNOSIS — N939 Abnormal uterine and vaginal bleeding, unspecified: Secondary | ICD-10-CM | POA: Insufficient documentation

## 2022-02-04 DIAGNOSIS — R102 Pelvic and perineal pain: Secondary | ICD-10-CM | POA: Diagnosis present

## 2022-02-04 DIAGNOSIS — R1084 Generalized abdominal pain: Secondary | ICD-10-CM | POA: Diagnosis present

## 2022-02-04 LAB — POCT URINALYSIS DIP (MANUAL ENTRY)
Bilirubin, UA: NEGATIVE
Glucose, UA: NEGATIVE mg/dL
Ketones, POC UA: NEGATIVE mg/dL
Nitrite, UA: NEGATIVE
Protein Ur, POC: 30 mg/dL — AB
Spec Grav, UA: 1.02 (ref 1.010–1.025)
Urobilinogen, UA: 0.2 E.U./dL
pH, UA: 7.5 (ref 5.0–8.0)

## 2022-02-04 LAB — POCT URINE PREGNANCY: Preg Test, Ur: NEGATIVE

## 2022-02-04 MED ORDER — MELOXICAM 7.5 MG PO TABS: 7.5000 mg | ORAL_TABLET | Freq: Every day | ORAL | 0 refills | Status: DC

## 2022-02-04 NOTE — ED Triage Notes (Signed)
Pt reports on and off lower abdominal pain since December 2022. Pt reports he last menstrual period was 10/01/22 and had a big blood clot, looks like a miscarriage.  ?

## 2022-02-04 NOTE — ED Provider Notes (Signed)
?[facility] ? ? ?MRN: [id number] DOB: 1991/07/13 ? ?Subjective:  ? ?Laurie Norris is a 31 y.o. female presenting for 12-month history of persistent intermittent abdominal pains, pelvic pains.  She is also had amenorrhea since December.  LMP was 10/01/2022.  She is normally very regular.  Has a history of a tubal ligation but reports that she believes she has had abortions since then.  Tubal ligation was in 2015 or 2016 to the best of her knowledge.  This past month in March at the end of March going into beginning of April she did pass a lot of blood clots vaginally.  Feels like this may have been a miscarriage.  Has a history of ovarian cysts.  She is also sexually active with 1 female partner, does not use condoms for protection.  No fever, nausea, vomiting, constipation, diarrhea, genital rashes.  She does have a history of an umbilical hernia that was surgically repaired. ? ?No current facility-administered medications for this encounter. ? ?Current Outpatient Medications:  ?  atorvastatin (LIPITOR) 40 MG tablet, Take 40 mg by mouth daily., Disp: , Rfl:  ?  furosemide (LASIX) 20 MG tablet, Take 20 mg by mouth daily., Disp: , Rfl:  ?  hydrochlorothiazide (HYDRODIURIL) 25 MG tablet, Take 25 mg by mouth daily., Disp: , Rfl:  ?  ibuprofen (ADVIL) 600 MG tablet, Take 1 tablet (600 mg total) by mouth every 6 (six) hours as needed., Disp: 30 tablet, Rfl: 0  ? ?Allergies  ?Allergen Reactions  ? Penicillins Anaphylaxis and Swelling  ?  Has patient had a PCN reaction causing immediate rash, facial/tongue/throat swelling, SOB or lightheadedness with hypotension: Yes ?Has patient had a PCN reaction causing severe rash involving mucus membranes or skin necrosis: Yes ?Has patient had a PCN reaction that required hospitalization: Yes ?Has patient had a PCN reaction occurring within the last 10 years: No ?If all of the above answers are "NO", then may proceed with Cephalosporin use. ?  ? Naproxen Nausea Only   ?  Intolerant to  ? ? ?Past Medical History:  ?Diagnosis Date  ? Chronic back pain 10/26/2014  ? History of narcotic use 10/26/2014  ? Pregnant 10/26/2014  ? Scoliosis   ?  ? ?Past Surgical History:  ?Procedure Laterality Date  ? KNEE SURGERY    ? leg surgery from trauma    ? TUBAL LIGATION Bilateral 04/17/2015  ? Procedure: POST PARTUM TUBAL LIGATION;  Surgeon: Adam Phenix, MD;  Location: [facility];  Service: Gynecology;  Laterality: Bilateral;  ? ? ?Family History  ?Problem Relation Age of Onset  ? Diabetes Brother   ? COPD Maternal Grandmother   ? Arthritis Maternal Grandfather   ? ? ?Social History  ? ?Tobacco Use  ? Smoking status: Every Day  ?  Packs/day: 1.00  ?  Years: 4.00  ?  Pack years: 4.00  ?  Types: Cigarettes  ? Smokeless tobacco: Never  ?Substance Use Topics  ? Alcohol use: No  ? Drug use: No  ?  Types: Marijuana  ?  Comment: last used percocet 6/17, last used marijuana with positive pregnancy test   ? ? ?ROS ? ? ?Objective:  ? ?Vitals: ?BP 127/87 (BP Location: Right Arm)   Pulse 96   Temp 98.7 ?F (37.1 ?C) (Oral)   Resp 16   SpO2 97%   Breastfeeding No  ? ?LMP was 10/01/2022.  ? ?Physical Exam ?Constitutional:   ?   General: She is not in acute distress. ?  Appearance: Normal appearance. She is well-developed. She is not ill-appearing, toxic-appearing or diaphoretic.  ?HENT:  ?   Head: Normocephalic and atraumatic.  ?   Nose: Nose normal.  ?   Mouth/Throat:  ?   Mouth: Mucous membranes are moist.  ?   Pharynx: Oropharynx is clear.  ?Eyes:  ?   General: No scleral icterus.    ?   Right eye: No discharge.     ?   Left eye: No discharge.  ?   Extraocular Movements: Extraocular movements intact.  ?   Conjunctiva/sclera: Conjunctivae normal.  ?Cardiovascular:  ?   Rate and Rhythm: Normal rate.  ?Pulmonary:  ?   Effort: Pulmonary effort is normal.  ?Abdominal:  ?   General: Bowel sounds are normal. There is no distension.  ?   Palpations: Abdomen is soft. There is no mass.  ?   Tenderness: There is  generalized abdominal tenderness (mild). There is no right CVA tenderness, left CVA tenderness, guarding or rebound.  ?Skin: ?   General: Skin is warm and dry.  ?Neurological:  ?   General: No focal deficit present.  ?   Mental Status: She is alert and oriented to person, place, and time.  ?Psychiatric:     ?   Mood and Affect: Mood normal.     ?   Behavior: Behavior normal.     ?   Thought Content: Thought content normal.     ?   Judgment: Judgment normal.  ? ? ?Results for orders placed or performed during the hospital encounter of 02/04/22 (from the past 24 hour(s))  ?POCT urinalysis dipstick     Status: Abnormal  ? Collection Time: 02/04/22  1:31 PM  ?Result Value Ref Range  ? Color, UA yellow yellow  ? Clarity, UA hazy (A) clear  ? Glucose, UA negative negative mg/dL  ? Bilirubin, UA negative negative  ? Ketones, POC UA negative negative mg/dL  ? Spec Grav, UA 1.020 1.010 - 1.025  ? Blood, UA trace-intact (A) negative  ? pH, UA 7.5 5.0 - 8.0  ? Protein Ur, POC =30 (A) negative mg/dL  ? Urobilinogen, UA 0.2 0.2 or 1.0 E.U./dL  ? Nitrite, UA Negative Negative  ? Leukocytes, UA Small (1+) (A) Negative  ?POCT urine pregnancy     Status: None  ? Collection Time: 02/04/22  1:33 PM  ?Result Value Ref Range  ? Preg Test, Ur Negative Negative  ? ? ?Assessment and Plan :  ? ?PDMP not reviewed this encounter. ? ?1. Abnormal vaginal bleeding   ?2. Pelvic pain in female   ?3. Generalized abdominal pain   ? ?No signs of an acute abdomen, an acute gynecologic problem.  STI testing pending.  Recommended meloxicam for pain which I suspect are largely gynecologic in etiology.  Recommended following up with a gynecologist which she has not been to in years. Counseled patient on potential for adverse effects with medications prescribed/recommended today, ER and return-to-clinic precautions discussed, patient verbalized understanding. ? ?  ?Wallis Bamberg, PA-C ?02/04/22 1359 ? ?

## 2022-02-06 LAB — CERVICOVAGINAL ANCILLARY ONLY
Bacterial Vaginitis (gardnerella): POSITIVE — AB
Chlamydia: NEGATIVE
Comment: NEGATIVE
Comment: NEGATIVE
Comment: NEGATIVE
Comment: NORMAL
Neisseria Gonorrhea: NEGATIVE
Trichomonas: NEGATIVE

## 2022-02-07 ENCOUNTER — Telehealth (HOSPITAL_COMMUNITY): Payer: Self-pay | Admitting: Emergency Medicine

## 2022-02-07 MED ORDER — METRONIDAZOLE 500 MG PO TABS: 500.0000 mg | ORAL_TABLET | Freq: Two times a day (BID) | ORAL | 0 refills | Status: DC

## 2022-02-08 ENCOUNTER — Telehealth (HOSPITAL_COMMUNITY): Payer: Self-pay | Admitting: Emergency Medicine

## 2022-02-08 MED ORDER — METRONIDAZOLE 0.75 % VA GEL: 1.0000 | Freq: Every day | VAGINAL | 0 refills | Status: DC

## 2022-02-13 ENCOUNTER — Telehealth: Payer: Self-pay

## 2022-02-13 MED ORDER — METRONIDAZOLE 0.75 % VA GEL: 1.0000 | Freq: Every day | VAGINAL | 0 refills | Status: AC

## 2022-02-13 NOTE — Telephone Encounter (Signed)
Pt states metrogel was not at pharmacy. Prescription recent  ?

## 2022-12-21 ENCOUNTER — Encounter: Payer: Self-pay | Admitting: Radiology

## 2023-03-01 DIAGNOSIS — F411 Generalized anxiety disorder: Secondary | ICD-10-CM | POA: Diagnosis not present

## 2023-03-01 DIAGNOSIS — F17210 Nicotine dependence, cigarettes, uncomplicated: Secondary | ICD-10-CM | POA: Diagnosis not present

## 2023-03-01 DIAGNOSIS — F909 Attention-deficit hyperactivity disorder, unspecified type: Secondary | ICD-10-CM | POA: Diagnosis not present

## 2023-03-01 DIAGNOSIS — F1120 Opioid dependence, uncomplicated: Secondary | ICD-10-CM | POA: Diagnosis not present

## 2023-03-01 DIAGNOSIS — F1110 Opioid abuse, uncomplicated: Secondary | ICD-10-CM | POA: Diagnosis not present

## 2023-03-14 DIAGNOSIS — F1120 Opioid dependence, uncomplicated: Secondary | ICD-10-CM | POA: Diagnosis not present

## 2023-03-14 DIAGNOSIS — F1110 Opioid abuse, uncomplicated: Secondary | ICD-10-CM | POA: Diagnosis not present

## 2023-03-21 DIAGNOSIS — F1120 Opioid dependence, uncomplicated: Secondary | ICD-10-CM | POA: Diagnosis not present

## 2023-03-21 DIAGNOSIS — Z1331 Encounter for screening for depression: Secondary | ICD-10-CM | POA: Diagnosis not present

## 2023-03-21 DIAGNOSIS — F17210 Nicotine dependence, cigarettes, uncomplicated: Secondary | ICD-10-CM | POA: Diagnosis not present

## 2023-03-28 DIAGNOSIS — F17210 Nicotine dependence, cigarettes, uncomplicated: Secondary | ICD-10-CM | POA: Diagnosis not present

## 2023-03-28 DIAGNOSIS — Z1331 Encounter for screening for depression: Secondary | ICD-10-CM | POA: Diagnosis not present

## 2023-03-28 DIAGNOSIS — F1110 Opioid abuse, uncomplicated: Secondary | ICD-10-CM | POA: Diagnosis not present

## 2023-03-28 DIAGNOSIS — F1120 Opioid dependence, uncomplicated: Secondary | ICD-10-CM | POA: Diagnosis not present

## 2023-04-04 DIAGNOSIS — F909 Attention-deficit hyperactivity disorder, unspecified type: Secondary | ICD-10-CM | POA: Diagnosis not present

## 2023-04-04 DIAGNOSIS — F17210 Nicotine dependence, cigarettes, uncomplicated: Secondary | ICD-10-CM | POA: Diagnosis not present

## 2023-04-04 DIAGNOSIS — F411 Generalized anxiety disorder: Secondary | ICD-10-CM | POA: Diagnosis not present

## 2023-04-04 DIAGNOSIS — Z716 Tobacco abuse counseling: Secondary | ICD-10-CM | POA: Diagnosis not present

## 2023-04-04 DIAGNOSIS — F1120 Opioid dependence, uncomplicated: Secondary | ICD-10-CM | POA: Diagnosis not present

## 2023-04-11 DIAGNOSIS — Z1331 Encounter for screening for depression: Secondary | ICD-10-CM | POA: Diagnosis not present

## 2023-04-11 DIAGNOSIS — F17210 Nicotine dependence, cigarettes, uncomplicated: Secondary | ICD-10-CM | POA: Diagnosis not present

## 2023-04-11 DIAGNOSIS — F1120 Opioid dependence, uncomplicated: Secondary | ICD-10-CM | POA: Diagnosis not present

## 2023-04-12 DIAGNOSIS — F1120 Opioid dependence, uncomplicated: Secondary | ICD-10-CM | POA: Diagnosis not present

## 2023-04-12 DIAGNOSIS — F1110 Opioid abuse, uncomplicated: Secondary | ICD-10-CM | POA: Diagnosis not present

## 2023-04-17 DIAGNOSIS — F1120 Opioid dependence, uncomplicated: Secondary | ICD-10-CM | POA: Diagnosis not present

## 2023-04-17 DIAGNOSIS — Z1331 Encounter for screening for depression: Secondary | ICD-10-CM | POA: Diagnosis not present

## 2023-04-17 DIAGNOSIS — F17210 Nicotine dependence, cigarettes, uncomplicated: Secondary | ICD-10-CM | POA: Diagnosis not present

## 2023-04-19 DIAGNOSIS — F1120 Opioid dependence, uncomplicated: Secondary | ICD-10-CM | POA: Diagnosis not present

## 2023-04-19 DIAGNOSIS — F1110 Opioid abuse, uncomplicated: Secondary | ICD-10-CM | POA: Diagnosis not present

## 2023-05-01 DIAGNOSIS — F1120 Opioid dependence, uncomplicated: Secondary | ICD-10-CM | POA: Diagnosis not present

## 2023-05-01 DIAGNOSIS — Z1331 Encounter for screening for depression: Secondary | ICD-10-CM | POA: Diagnosis not present

## 2023-05-01 DIAGNOSIS — F17210 Nicotine dependence, cigarettes, uncomplicated: Secondary | ICD-10-CM | POA: Diagnosis not present

## 2023-05-02 DIAGNOSIS — F1120 Opioid dependence, uncomplicated: Secondary | ICD-10-CM | POA: Diagnosis not present

## 2023-05-02 DIAGNOSIS — Z1331 Encounter for screening for depression: Secondary | ICD-10-CM | POA: Diagnosis not present

## 2023-05-02 DIAGNOSIS — F17210 Nicotine dependence, cigarettes, uncomplicated: Secondary | ICD-10-CM | POA: Diagnosis not present

## 2023-05-09 DIAGNOSIS — Z1331 Encounter for screening for depression: Secondary | ICD-10-CM | POA: Diagnosis not present

## 2023-05-09 DIAGNOSIS — F17210 Nicotine dependence, cigarettes, uncomplicated: Secondary | ICD-10-CM | POA: Diagnosis not present

## 2023-05-09 DIAGNOSIS — F1120 Opioid dependence, uncomplicated: Secondary | ICD-10-CM | POA: Diagnosis not present

## 2023-05-10 DIAGNOSIS — F1120 Opioid dependence, uncomplicated: Secondary | ICD-10-CM | POA: Diagnosis not present

## 2023-05-10 DIAGNOSIS — F1110 Opioid abuse, uncomplicated: Secondary | ICD-10-CM | POA: Diagnosis not present

## 2023-05-15 DIAGNOSIS — F1110 Opioid abuse, uncomplicated: Secondary | ICD-10-CM | POA: Diagnosis not present

## 2023-05-15 DIAGNOSIS — F17210 Nicotine dependence, cigarettes, uncomplicated: Secondary | ICD-10-CM | POA: Diagnosis not present

## 2023-05-15 DIAGNOSIS — F909 Attention-deficit hyperactivity disorder, unspecified type: Secondary | ICD-10-CM | POA: Diagnosis not present

## 2023-05-22 DIAGNOSIS — F1110 Opioid abuse, uncomplicated: Secondary | ICD-10-CM | POA: Diagnosis not present

## 2023-05-22 DIAGNOSIS — F17210 Nicotine dependence, cigarettes, uncomplicated: Secondary | ICD-10-CM | POA: Diagnosis not present

## 2023-05-22 DIAGNOSIS — F909 Attention-deficit hyperactivity disorder, unspecified type: Secondary | ICD-10-CM | POA: Diagnosis not present

## 2023-05-29 DIAGNOSIS — F909 Attention-deficit hyperactivity disorder, unspecified type: Secondary | ICD-10-CM | POA: Diagnosis not present

## 2023-05-29 DIAGNOSIS — F1110 Opioid abuse, uncomplicated: Secondary | ICD-10-CM | POA: Diagnosis not present

## 2023-05-29 DIAGNOSIS — F17210 Nicotine dependence, cigarettes, uncomplicated: Secondary | ICD-10-CM | POA: Diagnosis not present

## 2023-05-30 DIAGNOSIS — F1120 Opioid dependence, uncomplicated: Secondary | ICD-10-CM | POA: Diagnosis not present

## 2023-05-30 DIAGNOSIS — F1110 Opioid abuse, uncomplicated: Secondary | ICD-10-CM | POA: Diagnosis not present

## 2023-06-05 DIAGNOSIS — F17200 Nicotine dependence, unspecified, uncomplicated: Secondary | ICD-10-CM | POA: Diagnosis not present

## 2023-06-05 DIAGNOSIS — F1110 Opioid abuse, uncomplicated: Secondary | ICD-10-CM | POA: Diagnosis not present

## 2023-06-05 DIAGNOSIS — F909 Attention-deficit hyperactivity disorder, unspecified type: Secondary | ICD-10-CM | POA: Diagnosis not present

## 2023-06-12 DIAGNOSIS — Z1331 Encounter for screening for depression: Secondary | ICD-10-CM | POA: Diagnosis not present

## 2023-06-12 DIAGNOSIS — F1120 Opioid dependence, uncomplicated: Secondary | ICD-10-CM | POA: Diagnosis not present

## 2023-06-12 DIAGNOSIS — Z716 Tobacco abuse counseling: Secondary | ICD-10-CM | POA: Diagnosis not present

## 2023-06-12 DIAGNOSIS — F17210 Nicotine dependence, cigarettes, uncomplicated: Secondary | ICD-10-CM | POA: Diagnosis not present

## 2023-06-20 DIAGNOSIS — Z1331 Encounter for screening for depression: Secondary | ICD-10-CM | POA: Diagnosis not present

## 2023-06-20 DIAGNOSIS — Z716 Tobacco abuse counseling: Secondary | ICD-10-CM | POA: Diagnosis not present

## 2023-06-20 DIAGNOSIS — F17210 Nicotine dependence, cigarettes, uncomplicated: Secondary | ICD-10-CM | POA: Diagnosis not present

## 2023-06-20 DIAGNOSIS — F1120 Opioid dependence, uncomplicated: Secondary | ICD-10-CM | POA: Diagnosis not present

## 2023-06-21 DIAGNOSIS — F1120 Opioid dependence, uncomplicated: Secondary | ICD-10-CM | POA: Diagnosis not present

## 2023-06-21 DIAGNOSIS — F1110 Opioid abuse, uncomplicated: Secondary | ICD-10-CM | POA: Diagnosis not present

## 2023-06-26 DIAGNOSIS — Z716 Tobacco abuse counseling: Secondary | ICD-10-CM | POA: Diagnosis not present

## 2023-06-26 DIAGNOSIS — F17210 Nicotine dependence, cigarettes, uncomplicated: Secondary | ICD-10-CM | POA: Diagnosis not present

## 2023-06-26 DIAGNOSIS — Z1331 Encounter for screening for depression: Secondary | ICD-10-CM | POA: Diagnosis not present

## 2023-06-26 DIAGNOSIS — F1120 Opioid dependence, uncomplicated: Secondary | ICD-10-CM | POA: Diagnosis not present

## 2023-06-27 DIAGNOSIS — F1110 Opioid abuse, uncomplicated: Secondary | ICD-10-CM | POA: Diagnosis not present

## 2023-06-27 DIAGNOSIS — F17210 Nicotine dependence, cigarettes, uncomplicated: Secondary | ICD-10-CM | POA: Diagnosis not present

## 2023-06-27 DIAGNOSIS — F909 Attention-deficit hyperactivity disorder, unspecified type: Secondary | ICD-10-CM | POA: Diagnosis not present

## 2023-07-03 DIAGNOSIS — F17210 Nicotine dependence, cigarettes, uncomplicated: Secondary | ICD-10-CM | POA: Diagnosis not present

## 2023-07-03 DIAGNOSIS — F1120 Opioid dependence, uncomplicated: Secondary | ICD-10-CM | POA: Diagnosis not present

## 2023-07-03 DIAGNOSIS — F411 Generalized anxiety disorder: Secondary | ICD-10-CM | POA: Diagnosis not present

## 2023-07-03 DIAGNOSIS — F909 Attention-deficit hyperactivity disorder, unspecified type: Secondary | ICD-10-CM | POA: Diagnosis not present

## 2023-07-10 DIAGNOSIS — F909 Attention-deficit hyperactivity disorder, unspecified type: Secondary | ICD-10-CM | POA: Diagnosis not present

## 2023-07-10 DIAGNOSIS — F17210 Nicotine dependence, cigarettes, uncomplicated: Secondary | ICD-10-CM | POA: Diagnosis not present

## 2023-07-10 DIAGNOSIS — F1110 Opioid abuse, uncomplicated: Secondary | ICD-10-CM | POA: Diagnosis not present

## 2023-07-11 DIAGNOSIS — F17210 Nicotine dependence, cigarettes, uncomplicated: Secondary | ICD-10-CM | POA: Diagnosis not present

## 2023-07-11 DIAGNOSIS — Z1331 Encounter for screening for depression: Secondary | ICD-10-CM | POA: Diagnosis not present

## 2023-07-11 DIAGNOSIS — Z716 Tobacco abuse counseling: Secondary | ICD-10-CM | POA: Diagnosis not present

## 2023-07-11 DIAGNOSIS — F1120 Opioid dependence, uncomplicated: Secondary | ICD-10-CM | POA: Diagnosis not present

## 2023-07-12 DIAGNOSIS — F1120 Opioid dependence, uncomplicated: Secondary | ICD-10-CM | POA: Diagnosis not present

## 2023-07-12 DIAGNOSIS — F1110 Opioid abuse, uncomplicated: Secondary | ICD-10-CM | POA: Diagnosis not present

## 2023-07-20 DIAGNOSIS — F17210 Nicotine dependence, cigarettes, uncomplicated: Secondary | ICD-10-CM | POA: Diagnosis not present

## 2023-07-20 DIAGNOSIS — Z716 Tobacco abuse counseling: Secondary | ICD-10-CM | POA: Diagnosis not present

## 2023-07-20 DIAGNOSIS — F1120 Opioid dependence, uncomplicated: Secondary | ICD-10-CM | POA: Diagnosis not present

## 2023-07-21 DIAGNOSIS — F1120 Opioid dependence, uncomplicated: Secondary | ICD-10-CM | POA: Diagnosis not present

## 2023-07-24 DIAGNOSIS — F17200 Nicotine dependence, unspecified, uncomplicated: Secondary | ICD-10-CM | POA: Diagnosis not present

## 2023-07-24 DIAGNOSIS — F909 Attention-deficit hyperactivity disorder, unspecified type: Secondary | ICD-10-CM | POA: Diagnosis not present

## 2023-07-24 DIAGNOSIS — F1110 Opioid abuse, uncomplicated: Secondary | ICD-10-CM | POA: Diagnosis not present

## 2023-07-26 DIAGNOSIS — F1120 Opioid dependence, uncomplicated: Secondary | ICD-10-CM | POA: Diagnosis not present

## 2023-07-31 DIAGNOSIS — F1110 Opioid abuse, uncomplicated: Secondary | ICD-10-CM | POA: Diagnosis not present

## 2023-07-31 DIAGNOSIS — F17200 Nicotine dependence, unspecified, uncomplicated: Secondary | ICD-10-CM | POA: Diagnosis not present

## 2023-07-31 DIAGNOSIS — F909 Attention-deficit hyperactivity disorder, unspecified type: Secondary | ICD-10-CM | POA: Diagnosis not present

## 2023-08-02 DIAGNOSIS — F1120 Opioid dependence, uncomplicated: Secondary | ICD-10-CM | POA: Diagnosis not present

## 2023-08-09 ENCOUNTER — Ambulatory Visit
Admission: RE | Admit: 2023-08-09 | Discharge: 2023-08-09 | Disposition: A | Discharge status: Home / Self Care | Payer: 59 | Source: Ambulatory Visit | Attending: Nurse Practitioner | Admitting: Nurse Practitioner

## 2023-08-09 VITALS — BP 133/82 | HR 122 | Temp 101.5°F | Resp 18

## 2023-08-09 DIAGNOSIS — J069 Acute upper respiratory infection, unspecified: Secondary | ICD-10-CM | POA: Diagnosis not present

## 2023-08-09 DIAGNOSIS — J029 Acute pharyngitis, unspecified: Secondary | ICD-10-CM | POA: Diagnosis not present

## 2023-08-09 DIAGNOSIS — F1120 Opioid dependence, uncomplicated: Secondary | ICD-10-CM | POA: Diagnosis not present

## 2023-08-09 LAB — POCT INFLUENZA A/B
Influenza A, POC: NEGATIVE
Influenza B, POC: NEGATIVE

## 2023-08-09 LAB — POCT RAPID STREP A (OFFICE): Rapid Strep A Screen: NEGATIVE

## 2023-08-09 MED ORDER — PROMETHAZINE-DM 6.25-15 MG/5ML PO SYRP: 5.0000 mL | ORAL_SOLUTION | Freq: Four times a day (QID) | ORAL | 0 refills | Status: AC | PRN

## 2023-08-09 MED ORDER — FLUTICASONE PROPIONATE 50 MCG/ACT NA SUSP: 2.0000 | Freq: Every day | NASAL | 0 refills | Status: AC

## 2023-08-09 MED ORDER — ACETAMINOPHEN 325 MG PO TABS
975.0000 mg | ORAL_TABLET | Freq: Once | ORAL | Status: AC
  Administered 2023-08-09: 975 mg via ORAL

## 2023-08-09 MED ORDER — LIDOCAINE VISCOUS HCL 2 % MT SOLN: OROMUCOSAL | 0 refills | Status: AC

## 2023-08-09 NOTE — ED Provider Notes (Signed)
[facility]    CSN: [id number] Arrival date & time: 08/09/23  1252      History   Chief Complaint No chief complaint on file.   HPI Laurie Norris is a 32 y.o. female.   The history is provided by the patient.   Patient presents for complaints of fever, sore throat, body aches, chills, headache, and cough that is been present for the past several days.  Patient is currently febrile during this triage.  She denies ear pain, difficulty breathing, chest pain, abdominal pain, diarrhea, constipation or rash.  Patient reports she has been taking over-the-counter cough and cold medications for her symptoms.  She denies any obvious known sick contacts. . Past Medical History:  Diagnosis Date   Chronic back pain 10/26/2014   History of narcotic use 10/26/2014   Pregnant 10/26/2014   Scoliosis     Patient Active Problem List   Diagnosis Date Noted   Post-dates pregnancy 04/16/2015   Drug dependence during pregnancy (HCC) 01/20/2015   H/O macrosomia in infant in prior pregnancy, currently pregnant 01/18/2015   History of preterm delivery, currently pregnant 01/18/2015   Rh negative state in antepartum period 11/04/2014   Cocaine use complicating pregnancy 11/04/2014   Marijuana use 11/04/2014   Supervision of other high-risk pregnancy 11/03/2014   Smoker 11/03/2014   Chronic back pain 10/26/2014   History of narcotic use 10/26/2014   Mononeuritis leg 03/29/2011   Bucket handle tear of lateral meniscus 09/27/2009   KNEE JOINT INSTABILITY 09/01/2009    Past Surgical History:  Procedure Laterality Date   KNEE SURGERY     leg surgery from trauma     TUBAL LIGATION Bilateral 04/17/2015   Procedure: POST PARTUM TUBAL LIGATION;  Surgeon: Adam Phenix, MD;  Location: [facility];  Service: Gynecology;  Laterality: Bilateral;    OB History     Gravida  4   Para  4   Term  3   Preterm  1   AB      Living  2      SAB      IAB      Ectopic      Multiple  0    Live Births  2        Obstetric Comments  No GDM          Home Medications    Prior to Admission medications   Medication Sig Start Date End Date Taking? Authorizing Provider  buprenorphine-naloxone (SUBOXONE) 2-0.5 mg SUBL SL tablet Place 1 tablet under the tongue in the morning, at noon, and at bedtime.   Yes [provider]  fluticasone (FLONASE) 50 MCG/ACT nasal spray Place 2 sprays into both nostrils daily. 08/09/23  Yes Susana Duell-Warren, Sadie Haber, NP  lidocaine (XYLOCAINE) 2 % solution Gargle and spit 5 mL every 6 hours as needed for throat pain or discomfort. 08/09/23  Yes Veva Grimley-Warren, Sadie Haber, NP  promethazine-dextromethorphan (PROMETHAZINE-DM) 6.25-15 MG/5ML syrup Take 5 mLs by mouth 4 (four) times daily as needed. 08/09/23  Yes Caidyn Henricksen-Warren, Sadie Haber, NP    Family History Family History  Problem Relation Age of Onset   Diabetes Brother    COPD Maternal Grandmother    Arthritis Maternal Grandfather     Social History Social History   Tobacco Use   Smoking status: Every Day    Current packs/day: 1.00    Average packs/day: 1 pack/day for 4.0 years (4.0 ttl pk-yrs)    Types: Cigarettes  Smokeless tobacco: Never  Vaping Use   Vaping status: Never Used  Substance Use Topics   Alcohol use: No   Drug use: No    Types: Marijuana    Comment: last used percocet 6/17, last used marijuana with positive pregnancy test      Allergies   Penicillins and Naproxen   Review of Systems Review of Systems Per HPI  Physical Exam Triage Vital Signs ED Triage Vitals  Encounter Vitals Group     BP 08/09/23 1300 133/82     Systolic BP Percentile --      Diastolic BP Percentile --      Pulse Rate 08/09/23 1300 (!) 122     Resp 08/09/23 1300 18     Temp 08/09/23 1300 (!) 101.5 F (38.6 C)     Temp Source 08/09/23 1300 Oral     SpO2 08/09/23 1300 93 %     Weight --      Height --      Head Circumference --      Peak Flow --      Pain Score  08/09/23 1302 5     Pain Loc --      Pain Education --      Exclude from Growth Chart --    No data found.  Updated Vital Signs BP 133/82 (BP Location: Right Arm)   Pulse (!) 122   Temp (!) 101.5 F (38.6 C) (Oral)   Resp 18   LMP 07/25/2023 (Approximate)   SpO2 93%   Visual Acuity Right Eye Distance:   Left Eye Distance:   Bilateral Distance:    Right Eye Near:   Left Eye Near:    Bilateral Near:     Physical Exam Vitals and nursing note reviewed.  Constitutional:      General: She is not in acute distress.    Appearance: Normal appearance.  HENT:     Head: Normocephalic.     Right Ear: Tympanic membrane, ear canal and external ear normal.     Left Ear: Tympanic membrane, ear canal and external ear normal.     Nose: Congestion present.     Right Turbinates: Enlarged and swollen.     Left Turbinates: Enlarged and swollen.     Right Sinus: No maxillary sinus tenderness or frontal sinus tenderness.     Left Sinus: No maxillary sinus tenderness or frontal sinus tenderness.     Mouth/Throat:     Lips: Pink.     Mouth: Mucous membranes are moist.     Pharynx: Uvula midline. Pharyngeal swelling, posterior oropharyngeal erythema and postnasal drip present. No oropharyngeal exudate or uvula swelling.     Tonsils: No tonsillar exudate. 1+ on the right. 1+ on the left.  Eyes:     Extraocular Movements: Extraocular movements intact.     Conjunctiva/sclera: Conjunctivae normal.     Pupils: Pupils are equal, round, and reactive to light.  Cardiovascular:     Rate and Rhythm: Normal rate and regular rhythm.     Pulses: Normal pulses.     Heart sounds: Normal heart sounds.  Pulmonary:     Effort: Pulmonary effort is normal. No respiratory distress.     Breath sounds: Normal breath sounds. No stridor. No wheezing, rhonchi or rales.  Abdominal:     General: Bowel sounds are normal.     Palpations: Abdomen is soft.     Tenderness: There is no abdominal tenderness.   Musculoskeletal:     Cervical  back: Normal range of motion.  Neurological:     General: No focal deficit present.     Mental Status: She is alert and oriented to person, place, and time.  Psychiatric:        Mood and Affect: Mood normal.        Behavior: Behavior normal.      UC Treatments / Results  Labs (all labs ordered are listed, but only abnormal results are displayed) Labs Reviewed  CULTURE, GROUP A STREP [facility])  POCT INFLUENZA A/B  POCT RAPID STREP A (OFFICE)    EKG   Radiology No results found.  Procedures Procedures (including critical care time)  Medications Ordered in UC Medications  acetaminophen (TYLENOL) tablet 975 mg (975 mg Oral Given 08/09/23 1306)    Initial Impression / Assessment and Plan / UC Course  I have reviewed the triage vital signs and the nursing notes.  Pertinent labs & imaging results that were available during my care of the patient were reviewed by me and considered in my medical decision making (see chart for details).  The rapid strep test and influenza test were negative.  Throat culture is pending.  Will forego COVID testing as this will not change the course of treatment at this time.  Suspect a viral illness at this time.  Will provide symptomatic treatment to include Promethazine DM for the patient's cough, fluticasone 50 mcg nasal spray for nasal congestion, and viscous lidocaine for throat pain or discomfort.  Supportive care recommendations were provided and discussed with the patient to include continuing over-the-counter analgesics, increasing fluids, allowing for plenty of rest, warm salt water gargles, use of a humidifier in her bedroom at nighttime during sleep.  Patient was given indications of follow-up will be necessary.  Patient is in agreement with this plan of care and verbalizes understanding.  All questions were answered.  Patient stable for discharge.  Work note was provided.  Final Clinical Impressions(s) / UC  Diagnoses   Final diagnoses:  Viral upper respiratory tract infection with cough  Sore throat     Discharge Instructions      The rapid strep test and influenza test were negative.  Throat culture is pending.  You will be contacted if the pending test result is positive.  You will also have access to your results via MyChart. Take medication as prescribed. Continue over-the-counter Tylenol or ibuprofen as needed for pain, fever, general discomfort. Increase fluids and allow for plenty of rest. Warm salt water gargles 3-4 times daily as needed for throat pain or discomfort. Recommend normal saline nasal spray throughout the day to help with nasal congestion and runny nose. For the cough, recommend using a humidifier in your bedroom at nighttime during sleep and sleeping elevated on pillows while cough symptoms persist. You should remain home until you have been fever free for at least 24 hours with no medication. If symptoms do not begin to improve over the next several days, you may follow-up in this clinic or with your primary care physician for further evaluation. Follow-up as needed.     ED Prescriptions     Medication Sig Dispense Auth. Provider   lidocaine (XYLOCAINE) 2 % solution Gargle and spit 5 mL every 6 hours as needed for throat pain or discomfort. 100 mL Kosei Rhodes-Warren, Sadie Haber, NP   promethazine-dextromethorphan (PROMETHAZINE-DM) 6.25-15 MG/5ML syrup Take 5 mLs by mouth 4 (four) times daily as needed. 118 mL Nazaiah Navarrete-Warren, Sadie Haber, NP   fluticasone (FLONASE) 50 MCG/ACT nasal  spray Place 2 sprays into both nostrils daily. 16 g Kem Parcher-Warren, Sadie Haber, NP      PDMP not reviewed this encounter.   Abran Cantor, NP 08/09/23 1413

## 2023-08-09 NOTE — ED Triage Notes (Signed)
Fever, sore throat, cough, body aches, chills vomiting, headache since Saturday.

## 2023-08-09 NOTE — Discharge Instructions (Addendum)
The rapid strep test and influenza test were negative.  Throat culture is pending.  You will be contacted if the pending test result is positive.  You will also have access to your results via MyChart. Take medication as prescribed. Continue over-the-counter Tylenol or ibuprofen as needed for pain, fever, general discomfort. Increase fluids and allow for plenty of rest. Warm salt water gargles 3-4 times daily as needed for throat pain or discomfort. Recommend normal saline nasal spray throughout the day to help with nasal congestion and runny nose. For the cough, recommend using a humidifier in your bedroom at nighttime during sleep and sleeping elevated on pillows while cough symptoms persist. You should remain home until you have been fever free for at least 24 hours with no medication. If symptoms do not begin to improve over the next several days, you may follow-up in this clinic or with your primary care physician for further evaluation. Follow-up as needed.

## 2023-08-12 LAB — CULTURE, GROUP A STREP (THRC)

## 2023-08-13 NOTE — Plan of Care (Signed)
[facility] Tonsillectomy/Adenoidectomy, Postoperative PEDS care plan entered in error.

## 2023-08-15 DIAGNOSIS — F17210 Nicotine dependence, cigarettes, uncomplicated: Secondary | ICD-10-CM | POA: Diagnosis not present

## 2023-08-15 DIAGNOSIS — F1120 Opioid dependence, uncomplicated: Secondary | ICD-10-CM | POA: Diagnosis not present

## 2023-08-15 DIAGNOSIS — Z716 Tobacco abuse counseling: Secondary | ICD-10-CM | POA: Diagnosis not present

## 2023-08-16 DIAGNOSIS — F1120 Opioid dependence, uncomplicated: Secondary | ICD-10-CM | POA: Diagnosis not present

## 2023-08-23 DIAGNOSIS — F1120 Opioid dependence, uncomplicated: Secondary | ICD-10-CM | POA: Diagnosis not present

## 2023-08-27 DIAGNOSIS — F1120 Opioid dependence, uncomplicated: Secondary | ICD-10-CM | POA: Diagnosis not present

## 2023-08-29 DIAGNOSIS — F17210 Nicotine dependence, cigarettes, uncomplicated: Secondary | ICD-10-CM | POA: Diagnosis not present

## 2023-08-29 DIAGNOSIS — Z716 Tobacco abuse counseling: Secondary | ICD-10-CM | POA: Diagnosis not present

## 2023-08-29 DIAGNOSIS — F1120 Opioid dependence, uncomplicated: Secondary | ICD-10-CM | POA: Diagnosis not present

## 2023-09-03 DIAGNOSIS — F1120 Opioid dependence, uncomplicated: Secondary | ICD-10-CM | POA: Diagnosis not present

## 2023-09-05 DIAGNOSIS — Z716 Tobacco abuse counseling: Secondary | ICD-10-CM | POA: Diagnosis not present

## 2023-09-05 DIAGNOSIS — F17210 Nicotine dependence, cigarettes, uncomplicated: Secondary | ICD-10-CM | POA: Diagnosis not present

## 2023-09-05 DIAGNOSIS — F1120 Opioid dependence, uncomplicated: Secondary | ICD-10-CM | POA: Diagnosis not present

## 2023-09-10 DIAGNOSIS — F1120 Opioid dependence, uncomplicated: Secondary | ICD-10-CM | POA: Diagnosis not present

## 2023-09-12 DIAGNOSIS — F1120 Opioid dependence, uncomplicated: Secondary | ICD-10-CM | POA: Diagnosis not present

## 2023-09-12 DIAGNOSIS — F17210 Nicotine dependence, cigarettes, uncomplicated: Secondary | ICD-10-CM | POA: Diagnosis not present

## 2023-09-12 DIAGNOSIS — Z716 Tobacco abuse counseling: Secondary | ICD-10-CM | POA: Diagnosis not present

## 2023-09-17 DIAGNOSIS — F1120 Opioid dependence, uncomplicated: Secondary | ICD-10-CM | POA: Diagnosis not present
